# Patient Record
Sex: Male | Born: 1935 | Hispanic: No | Marital: Married | Smoking: Never smoker
Health system: Southern US, Community
[De-identification: ages and names within clinical notes are randomized; demographics above are authoritative.]

## PROBLEM LIST (undated history)

## (undated) DIAGNOSIS — I4510 Unspecified right bundle-branch block: Secondary | ICD-10-CM

## (undated) DIAGNOSIS — E785 Hyperlipidemia, unspecified: Secondary | ICD-10-CM

## (undated) DIAGNOSIS — H3530 Unspecified macular degeneration: Secondary | ICD-10-CM

## (undated) DIAGNOSIS — I10 Essential (primary) hypertension: Secondary | ICD-10-CM

## (undated) DIAGNOSIS — H409 Unspecified glaucoma: Secondary | ICD-10-CM

## (undated) DIAGNOSIS — N183 Chronic kidney disease, stage 3 unspecified: Secondary | ICD-10-CM

## (undated) DIAGNOSIS — R32 Unspecified urinary incontinence: Secondary | ICD-10-CM

## (undated) DIAGNOSIS — E7800 Pure hypercholesterolemia, unspecified: Secondary | ICD-10-CM

## (undated) DIAGNOSIS — K635 Polyp of colon: Secondary | ICD-10-CM

## (undated) DIAGNOSIS — G309 Alzheimer's disease, unspecified: Secondary | ICD-10-CM

## (undated) DIAGNOSIS — M545 Low back pain, unspecified: Secondary | ICD-10-CM

## (undated) DIAGNOSIS — K4090 Unilateral inguinal hernia, without obstruction or gangrene, not specified as recurrent: Secondary | ICD-10-CM

## (undated) DIAGNOSIS — R413 Other amnesia: Secondary | ICD-10-CM

## (undated) DIAGNOSIS — I5032 Chronic diastolic (congestive) heart failure: Secondary | ICD-10-CM

## (undated) DIAGNOSIS — F0280 Dementia in other diseases classified elsewhere without behavioral disturbance: Secondary | ICD-10-CM

## (undated) DIAGNOSIS — R972 Elevated prostate specific antigen [PSA]: Secondary | ICD-10-CM

## (undated) DIAGNOSIS — N429 Disorder of prostate, unspecified: Secondary | ICD-10-CM

## (undated) DIAGNOSIS — R9439 Abnormal result of other cardiovascular function study: Secondary | ICD-10-CM

## (undated) HISTORY — DX: Abnormal result of other cardiovascular function study: R94.39

## (undated) HISTORY — DX: Chronic kidney disease, stage 3 unspecified: N18.30

## (undated) HISTORY — DX: Other amnesia: R41.3

## (undated) HISTORY — DX: Polyp of colon: K63.5

## (undated) HISTORY — DX: Unilateral inguinal hernia, without obstruction or gangrene, not specified as recurrent: K40.90

## (undated) HISTORY — DX: Chronic diastolic (congestive) heart failure: I50.32

## (undated) HISTORY — DX: Elevated prostate specific antigen (PSA): R97.20

## (undated) HISTORY — DX: Low back pain: M54.5

## (undated) HISTORY — DX: Unspecified right bundle-branch block: I45.10

## (undated) HISTORY — DX: Pure hypercholesterolemia, unspecified: E78.00

## (undated) HISTORY — DX: Hyperlipidemia, unspecified: E78.5

## (undated) HISTORY — DX: Chronic kidney disease, stage 3 (moderate): N18.3

## (undated) HISTORY — DX: Essential (primary) hypertension: I10

## (undated) HISTORY — DX: Unspecified urinary incontinence: R32

## (undated) HISTORY — DX: Low back pain, unspecified: M54.50

---

## 1994-02-18 HISTORY — PX: GALLBLADDER SURGERY: SHX652

## 2005-02-18 DIAGNOSIS — K635 Polyp of colon: Secondary | ICD-10-CM

## 2005-02-18 HISTORY — DX: Polyp of colon: K63.5

## 2012-05-14 ENCOUNTER — Encounter: Payer: Self-pay | Admitting: Podiatry

## 2012-05-20 ENCOUNTER — Ambulatory Visit: Payer: Self-pay | Admitting: Podiatry

## 2012-06-12 ENCOUNTER — Ambulatory Visit: Payer: Self-pay | Admitting: Podiatry

## 2012-07-20 ENCOUNTER — Telehealth: Payer: Self-pay | Admitting: Neurology

## 2012-07-20 NOTE — Telephone Encounter (Signed)
Called patient daughter to let her know,her father results where read to her brother and a sister in December of 2013. And nothing was alarming she states no one told her so that may be where the confusion coming from. She also wanted to see if her father can be worked in for next week so her sister who is a M.D can  Go over her father's care. I let her know i can place him on a cancellation list for next week, and have Dr. Pearlean Brownie call Rashmi M.D. # [phone] and go over her father care. Patient daughter showed understanding and relay to his other children. I will send a copy of test results to patient, Patient chart in [facility]

## 2012-07-28 ENCOUNTER — Telehealth: Payer: Self-pay | Admitting: Neurology

## 2012-07-28 NOTE — Telephone Encounter (Signed)
Called and spoke to daughter Everardo Beals). She just wanted test results mailed to patients address. Everardo Beals) is a doctor and would like a call at some know rush whenever Dr.Sethi has time because I know results are ok. [phone].  Will send to medical  Records to send results. ([facility]).

## 2012-07-28 NOTE — Telephone Encounter (Signed)
Patient's daughter Everardo Beals) is calling to let us know they are waiting for the results of MRI, Labs and EEG.  They are asking to please give them a call at [phone]

## 2012-10-02 ENCOUNTER — Other Ambulatory Visit: Payer: Self-pay | Admitting: *Deleted

## 2012-10-02 ENCOUNTER — Telehealth: Payer: Self-pay | Admitting: Neurology

## 2012-10-02 DIAGNOSIS — R269 Unspecified abnormalities of gait and mobility: Secondary | ICD-10-CM

## 2012-10-02 NOTE — Telephone Encounter (Signed)
Called patient to let them know Dr.Sethi has put in a referral for pt.

## 2012-10-21 ENCOUNTER — Ambulatory Visit: Payer: Medicare Other | Admitting: Physical Therapy

## 2012-10-23 ENCOUNTER — Ambulatory Visit: Payer: Medicare Other | Admitting: Physical Therapy

## 2012-10-23 ENCOUNTER — Other Ambulatory Visit: Payer: Self-pay

## 2012-10-23 ENCOUNTER — Ambulatory Visit: Payer: Medicare Other | Attending: Internal Medicine | Admitting: Physical Therapy

## 2012-10-23 DIAGNOSIS — R262 Difficulty in walking, not elsewhere classified: Secondary | ICD-10-CM | POA: Insufficient documentation

## 2012-10-23 DIAGNOSIS — IMO0001 Reserved for inherently not codable concepts without codable children: Secondary | ICD-10-CM | POA: Insufficient documentation

## 2012-10-23 DIAGNOSIS — M6281 Muscle weakness (generalized): Secondary | ICD-10-CM | POA: Insufficient documentation

## 2012-10-23 MED ORDER — MEMANTINE HCL 10 MG PO TABS: 10.0000 mg | ORAL_TABLET | Freq: Two times a day (BID) | ORAL | Status: DC

## 2012-10-27 ENCOUNTER — Ambulatory Visit: Payer: Medicare Other | Admitting: Physical Therapy

## 2012-10-30 ENCOUNTER — Ambulatory Visit: Payer: Medicare Other | Admitting: Physical Therapy

## 2012-11-03 ENCOUNTER — Ambulatory Visit: Payer: Medicare Other | Admitting: Physical Therapy

## 2012-11-04 ENCOUNTER — Ambulatory Visit: Payer: Medicare Other | Admitting: Physical Therapy

## 2012-11-09 ENCOUNTER — Encounter: Payer: Medicare Other | Admitting: Physical Therapy

## 2012-11-10 ENCOUNTER — Ambulatory Visit: Payer: Medicare Other | Admitting: Physical Therapy

## 2012-11-13 ENCOUNTER — Ambulatory Visit: Payer: Medicare Other | Admitting: Physical Therapy

## 2012-11-13 ENCOUNTER — Other Ambulatory Visit: Payer: Self-pay

## 2012-11-13 MED ORDER — DONEPEZIL HCL 10 MG PO TABS: 10.0000 mg | ORAL_TABLET | ORAL | Status: DC

## 2012-11-17 ENCOUNTER — Ambulatory Visit: Payer: Medicare Other | Admitting: Physical Therapy

## 2012-11-24 ENCOUNTER — Ambulatory Visit: Payer: Medicare Other | Attending: Internal Medicine | Admitting: Physical Therapy

## 2012-11-24 DIAGNOSIS — R262 Difficulty in walking, not elsewhere classified: Secondary | ICD-10-CM | POA: Insufficient documentation

## 2012-11-24 DIAGNOSIS — M6281 Muscle weakness (generalized): Secondary | ICD-10-CM | POA: Insufficient documentation

## 2012-11-24 DIAGNOSIS — IMO0001 Reserved for inherently not codable concepts without codable children: Secondary | ICD-10-CM | POA: Insufficient documentation

## 2012-12-01 ENCOUNTER — Ambulatory Visit: Payer: Medicare Other | Admitting: Physical Therapy

## 2012-12-08 ENCOUNTER — Ambulatory Visit: Payer: Medicare Other | Admitting: Physical Therapy

## 2012-12-11 ENCOUNTER — Ambulatory Visit: Payer: Medicare Other | Admitting: Physical Therapy

## 2013-01-03 ENCOUNTER — Emergency Department (HOSPITAL_BASED_OUTPATIENT_CLINIC_OR_DEPARTMENT_OTHER): Payer: Medicare Other

## 2013-01-03 ENCOUNTER — Emergency Department (HOSPITAL_BASED_OUTPATIENT_CLINIC_OR_DEPARTMENT_OTHER)
Admission: EM | Admit: 2013-01-03 | Discharge: 2013-01-03 | Disposition: A | Discharge status: Home / Self Care | Payer: Medicare Other | Attending: Emergency Medicine | Admitting: Emergency Medicine

## 2013-01-03 ENCOUNTER — Encounter (HOSPITAL_BASED_OUTPATIENT_CLINIC_OR_DEPARTMENT_OTHER): Payer: Self-pay | Admitting: Emergency Medicine

## 2013-01-03 DIAGNOSIS — G309 Alzheimer's disease, unspecified: Secondary | ICD-10-CM | POA: Insufficient documentation

## 2013-01-03 DIAGNOSIS — F0280 Dementia in other diseases classified elsewhere without behavioral disturbance: Secondary | ICD-10-CM | POA: Insufficient documentation

## 2013-01-03 DIAGNOSIS — R0602 Shortness of breath: Secondary | ICD-10-CM | POA: Insufficient documentation

## 2013-01-03 DIAGNOSIS — Z79899 Other long term (current) drug therapy: Secondary | ICD-10-CM | POA: Insufficient documentation

## 2013-01-03 DIAGNOSIS — E7800 Pure hypercholesterolemia, unspecified: Secondary | ICD-10-CM | POA: Insufficient documentation

## 2013-01-03 DIAGNOSIS — H409 Unspecified glaucoma: Secondary | ICD-10-CM | POA: Insufficient documentation

## 2013-01-03 DIAGNOSIS — E785 Hyperlipidemia, unspecified: Secondary | ICD-10-CM | POA: Insufficient documentation

## 2013-01-03 DIAGNOSIS — R1110 Vomiting, unspecified: Secondary | ICD-10-CM | POA: Insufficient documentation

## 2013-01-03 DIAGNOSIS — R7989 Other specified abnormal findings of blood chemistry: Secondary | ICD-10-CM

## 2013-01-03 DIAGNOSIS — Z87448 Personal history of other diseases of urinary system: Secondary | ICD-10-CM | POA: Insufficient documentation

## 2013-01-03 DIAGNOSIS — R799 Abnormal finding of blood chemistry, unspecified: Secondary | ICD-10-CM | POA: Insufficient documentation

## 2013-01-03 HISTORY — DX: Unspecified macular degeneration: H35.30

## 2013-01-03 HISTORY — DX: Dementia in other diseases classified elsewhere, unspecified severity, without behavioral disturbance, psychotic disturbance, mood disturbance, and anxiety: F02.80

## 2013-01-03 HISTORY — DX: Pure hypercholesterolemia, unspecified: E78.00

## 2013-01-03 HISTORY — DX: Alzheimer's disease, unspecified: G30.9

## 2013-01-03 HISTORY — DX: Unspecified glaucoma: H40.9

## 2013-01-03 HISTORY — DX: Disorder of prostate, unspecified: N42.9

## 2013-01-03 LAB — PRO B NATRIURETIC PEPTIDE: Pro B Natriuretic peptide (BNP): 2648 pg/mL — ABNORMAL HIGH (ref 0–450)

## 2013-01-03 LAB — CBC
MCHC: 32 g/dL (ref 30.0–36.0)
MCV: 91.1 fL (ref 78.0–100.0)
Platelets: 188 10*3/uL (ref 150–400)
RBC: 3.71 MIL/uL — ABNORMAL LOW (ref 4.22–5.81)
WBC: 5.6 10*3/uL (ref 4.0–10.5)

## 2013-01-03 LAB — COMPREHENSIVE METABOLIC PANEL
ALT: 10 U/L (ref 0–53)
AST: 14 U/L (ref 0–37)
Albumin: 3.1 g/dL — ABNORMAL LOW (ref 3.5–5.2)
CO2: 28 mEq/L (ref 19–32)
Calcium: 8.7 mg/dL (ref 8.4–10.5)
Chloride: 103 mEq/L (ref 96–112)
Creatinine, Ser: 1.7 mg/dL — ABNORMAL HIGH (ref 0.50–1.35)
GFR calc non Af Amer: 35 mL/min — ABNORMAL LOW (ref >90)
Sodium: 138 mEq/L (ref 135–145)
Total Bilirubin: 0.2 mg/dL — ABNORMAL LOW (ref 0.3–1.2)
Total Protein: 6.1 g/dL (ref 6.0–8.3)

## 2013-01-03 LAB — LIPASE, BLOOD: Lipase: 39 U/L (ref 11–59)

## 2013-01-03 MED ORDER — FUROSEMIDE 20 MG PO TABS: 20.0000 mg | ORAL_TABLET | Freq: Every day | ORAL | Status: DC

## 2013-01-03 MED ORDER — FUROSEMIDE 10 MG/ML IJ SOLN: 20.0000 mg | Freq: Once | INTRAMUSCULAR | Status: DC

## 2013-01-03 MED ORDER — FUROSEMIDE 20 MG PO TABS
20.0000 mg | ORAL_TABLET | Freq: Once | ORAL | Status: AC
  Administered 2013-01-03: 20 mg via ORAL
  Filled 2013-01-03: qty 1

## 2013-01-03 NOTE — ED Provider Notes (Signed)
CSN: [id number]     Arrival date & time 01/03/13  1803 History  This chart was scribed for David Munch, MD by Leone Payor, ED Scribe. This patient was seen in room MH11/MH11 and the patient's care was started 6:17 PM.    Chief Complaint  Patient presents with  . Chest Pain    The history is provided by the patient (and son). No language interpreter was used.     HPI Comments: David [name] is a 79 y.o. male with past medical history of HLD and Alzheimer disease who presents to the Emergency Department complaining of intermittent episodes of chest pain and SOB that began 3 days ago. Pt's son states that he has noticed pt has been walking but has had to stop and catch his breath with every few steps. Pt has a stress test scheduled later this week. Son states pt was walking outside today when he noticed him stopping every few steps to spit up, hold his chest and catch his breath. Pt denies any pain at this time. Son reports pt had 1 episode of vomiting last week. He states pt was on Prilosec but stopped taking it at some point in time. He was started on Prevacid 3 days ago. Pt denies abdominal pain. No history cardiac disease or CVA.    Past Medical History  Diagnosis Date  . Hyperlipemia   . Alzheimer disease   . High cholesterol   . Prostate disease   . Macular degeneration   . Glaucoma    Past Surgical History  Procedure Laterality Date  . Gallbladder surgery  1996   No family history on file. History  Substance Use Topics  . Smoking status: Never Smoker   . Smokeless tobacco: Not on file  . Alcohol Use: No    Review of Systems  Constitutional:       Per HPI, otherwise negative  HENT:       Per HPI, otherwise negative  Respiratory: Positive for shortness of breath.        Per HPI, otherwise negative  Cardiovascular: Positive for chest pain.       Per HPI, otherwise negative  Gastrointestinal: Positive for vomiting (1 episode last week). Negative for abdominal pain.   Endocrine:       Negative aside from HPI  Genitourinary:       Neg aside from HPI   Musculoskeletal:       Per HPI, otherwise negative  Skin: Negative.   Neurological: Negative for syncope.    Allergies  Review of patient's allergies indicates no known allergies.  Home Medications   Current Outpatient Rx  Name  Route  Sig  Dispense  Refill  . atorvastatin (LIPITOR) 10 MG tablet   Oral   Take 10 mg by mouth daily.         Marland Kitchen donepezil (ARICEPT) 10 MG tablet   Oral   Take 1 tablet (10 mg total) by mouth as directed.   30 tablet   1     Please Schedule Appt   . memantine (NAMENDA) 10 MG tablet   Oral   Take 1 tablet (10 mg total) by mouth 2 (two) times daily.   60 tablet   2     Please Schedule Appt    There were no vitals taken for this visit. Physical Exam  Nursing note and vitals reviewed. Constitutional: He is oriented to person, place, and time. He appears well-developed. No distress.  HENT:  Head: Normocephalic and  atraumatic.  Eyes: Conjunctivae and EOM are normal.  Cardiovascular: Normal rate, regular rhythm and normal heart sounds.   Pulmonary/Chest: Effort normal and breath sounds normal. No stridor. No respiratory distress.  Abdominal: Soft. Bowel sounds are normal. He exhibits no distension. There is no tenderness.  Musculoskeletal: He exhibits no edema.  Neurological: He is alert and oriented to person, place, and time.  Skin: Skin is warm and dry.  Psychiatric: He has a normal mood and affect.    ED Course  Procedures   DIAGNOSTIC STUDIES: Oxygen Saturation is 100% on RA, normal by my interpretation.    COORDINATION OF CARE: 6:20 PM Discussed treatment plan with pt at bedside and pt agreed to plan.   Labs Review Labs Reviewed - No data to display Imaging Review No results found.  EKG Interpretation     Ventricular Rate:  67 PR Interval:  184 QRS Duration: 136 QT Interval:  432 QTC Calculation: 456 R Axis:   69 Text  Interpretation:  Sinus rhythm with Premature atrial complexes with Abberant conduction Right bundle branch block Abnormal ECG           cardiac 70 sinus rhythm normal Pulse oximetry 96% room air normal   8:16 PM On repeat exam the patient appears comfortable.  He has no new complaints. I discussed all findings with the patient's son, and the patient's daughter via telephone.  She is a physician.  We all agreed that the patient will be more comfortable, given his dementia, and home, though he will need followup tomorrow with his primary care physician to arrange echocardiogram.  MDM   1. Elevated brain natriuretic peptide (BNP) level     I personally performed the services described in this documentation, which was scribed in my presence. The recorded information has been reviewed and is accurate.  This pleasant elderly male presents after episodes of dyspnea on exertion.  On exam he is awake and alert, with no complaints.  With the patient's description of ongoing dyspnea on exertion, and his sons endorsement of lower extremities edema, heart failure seems causative.  No evidence for ongoing coronary ischemia, though this will be evaluated as well with semiurgent echocardiogram, which will be performed as an outpatient given the patient's dementia, his need to return home for comfort.   David Munch, MD 01/03/13 2017

## 2013-01-03 NOTE — ED Notes (Signed)
Family states that patient has been having intermittent spells of taking a few steps and then having to stop and catch his breath. The family states that he was having chest pain PTA, but the patient denies pain now or prior to arrival. Received aspirin 81mg  prior to arrival as well.

## 2013-01-07 ENCOUNTER — Ambulatory Visit (INDEPENDENT_AMBULATORY_CARE_PROVIDER_SITE_OTHER): Payer: Medicare Other | Admitting: Interventional Cardiology

## 2013-01-07 ENCOUNTER — Encounter: Payer: Self-pay | Admitting: Interventional Cardiology

## 2013-01-07 VITALS — BP 132/64 | HR 70 | Ht 68.0 in | Wt 173.0 lb

## 2013-01-07 DIAGNOSIS — I10 Essential (primary) hypertension: Secondary | ICD-10-CM

## 2013-01-07 DIAGNOSIS — R079 Chest pain, unspecified: Secondary | ICD-10-CM

## 2013-01-07 DIAGNOSIS — R0602 Shortness of breath: Secondary | ICD-10-CM

## 2013-01-07 LAB — BRAIN NATRIURETIC PEPTIDE: Pro B Natriuretic peptide (BNP): 282 pg/mL — ABNORMAL HIGH (ref 0.0–100.0)

## 2013-01-07 NOTE — Progress Notes (Signed)
Patient ID: David [name], male   DOB: 1926-03-20, 79 y.o.   MRN: [id number]     Patient ID: David [name] MRN: [id number] DOB/AGE: 1926/06/01 79 y.o.   Referring Physician Dr. Georgann Housekeeper   Reason for Consultation: Shortness of breath with exertion, chest discomfort, lower extremity edema  HPI: 79 year old man with Alzheimer's dementia.  His family is noted that over the last few months, he has gotten more short of breath with walking. Before this, he walked regularly on a daily basis. He would walk 20-30 minutes without stopping. Now, he will walk for about 10 minutes and has to stop to catch his breath. They have also noticed increased lower chimney swelling. Despite his Alzheimer's, he remains active. He cares for himself for the most part.  Over the last 4 days, he has had 2 episodes of vomiting. The first episode was while walking last Sunday. He had to throw up and his son took him to the emergency room. He had a negative troponin at that time and was sent home. Due to the swelling, he was given a diuretic. They have not started the diuretic. He had been on a diuretic in the past for edema but had problems with incontinence.   The second episode was early in the morning. He woke up and told his wife that he felt some discomfort in his lower chest. He then took a baby aspirin. He vomited after that but then felt better.  He denies any orthopnea. He sleeps on 2 pillows. He has never had to sit up to breathe easier. His other daughter is a rheumatologist in [place]. She would like for him to have a stress test since he has never had that done. Of note, he had an echocardiogram in February of 2014. He had normal left jugular function but evidence of diastolic dysfunction. There is aortic sclerosis, mild mitral regurgitation and mild aortic regurgitation.   Current Outpatient Prescriptions  Medication Sig Dispense Refill  . atorvastatin (LIPITOR) 10 MG tablet Take 10 mg by mouth  daily.      . B Complex-C (SUPER B COMPLEX PO) Take by mouth. daily      . brimonidine (ALPHAGAN P) 0.1 % SOLN 3 drops.      . Cholecalciferol (VITAMIN D3) 3000 UNITS TABS Take by mouth. Daily      . donepezil (ARICEPT) 10 MG tablet Take 1 tablet (10 mg total) by mouth as directed.  30 tablet  1  . fish oil-omega-3 fatty acids 1000 MG capsule Take 2 g by mouth daily.      . folic acid (FOLVITE) 1 MG tablet Take 1 mg by mouth daily.      Marland Kitchen LORazepam (ATIVAN) 0.5 MG tablet As directed      . losartan (COZAAR) 100 MG tablet Take 100 mg by mouth daily.      . memantine (NAMENDA) 10 MG tablet Take 1 tablet (10 mg total) by mouth 2 (two) times daily.  60 tablet  2  . prednisoLONE acetate (PRED FORTE) 1 % ophthalmic suspension As directed      . tamsulosin (FLOMAX) 0.4 MG CAPS capsule Take 0.4 mg by mouth.       No current facility-administered medications for this visit.   Past Medical History  Diagnosis Date  . Hyperlipemia   . Alzheimer disease   . High cholesterol   . Prostate disease   . Macular degeneration   . Glaucoma     No family history on file.  History   Social History  . Marital Status: Married    Spouse Name: N/A    Number of Children: N/A  . Years of Education: N/A   Occupational History  . Not on file.   Social History Main Topics  . Smoking status: Never Smoker   . Smokeless tobacco: Not on file  . Alcohol Use: No  . Drug Use: No  . Sexual Activity: Not on file   Other Topics Concern  . Not on file   Social History Narrative  . No narrative on file    Past Surgical History  Procedure Laterality Date  . Gallbladder surgery  1996      (Not in a hospital admission)  Review of systems complete and found to be negative unless listed above .  No nausea, vomiting.  No fever chills, No focal weakness,  No palpitations.  Physical Exam: Filed Vitals:   01/07/13 1106  BP: 132/64  Pulse: 70    Weight: 173 lb (78.472 kg)  Physical exam:  [place]/AT EOMI No  JVD, No carotid bruit RRR S1S2  No wheezing Soft. NT, nondistended No edema. No focal motor or sensory deficits Normal affect  Labs:   Lab Results  Component Value Date   WBC 5.6 01/03/2013   HGB 10.8* 01/03/2013   HCT 33.8* 01/03/2013   MCV 91.1 01/03/2013   PLT 188 01/03/2013    Recent Labs Lab 01/03/13 1850  NA 138  K 4.5  CL 103  CO2 28  BUN 31*  CREATININE 1.70*  CALCIUM 8.7  PROT 6.1  BILITOT 0.2*  ALKPHOS 73  ALT 10  AST 14  GLUCOSE 99   Lab Results  Component Value Date   TROPONINI <0.30 01/03/2013    No results found for this basename: CHOL   No results found for this basename: HDL   No results found for this basename: LDLCALC   No results found for this basename: TRIG   No results found for this basename: CHOLHDL   No results found for this basename: LDLDIRECT      Radiology: No pneumonia, no pulmonary edema EKG: Normal sinus rhythm, right bundle branch block  ASSESSMENT AND PLAN:  Dyspnea on exertion: I spoke to the patient's daughter who lives in [place]. She is also a physician. She walked with him when she was visiting last week and noticed that he has to stop and catch his breath which is new for him. We'll check BNP. I think it is likely that he has some degree of diastolic dysfunction as shown by his echocardiogram. Will likely need a diuretic. He has had issues with diuretic in the past due to incontinence. She thinks this is just because of his Alzheimer's disease, he forgets to go the bathroom until very last minute. She will work on talking to her family about getting up to go to the bathroom on a regular basis. I also mentioned that he could take a diuretic only on days when he is staying at home.  Chest discomfort: He woke up at night with some epigastric/lower chest discomfort. Will check pharmacologic nuclear study to evaluate for ischemia.  Hypertension: Well controlled on current medical therapy. The daughter was concerned that  this was a new onset hypertension in an older patient. Given his overall status, and the fact his blood pressure is controlled, I would not work him up for secondary causes of hypertension at this point. If he develops refractory hypertension , we could look for other  sources. She is in agreement with this.  Signed:   Fredric Mare, MD, [facility] 01/07/2013, 11:33 AM

## 2013-01-07 NOTE — Patient Instructions (Signed)
Your physician has requested that you have a lexiscan myoview. For further information please visit [email or web address] Please follow instruction sheet, as given.  Your physician recommends that you return for lab work today for BNP.

## 2013-01-08 ENCOUNTER — Other Ambulatory Visit: Payer: Self-pay | Admitting: Neurology

## 2013-01-08 DIAGNOSIS — I10 Essential (primary) hypertension: Secondary | ICD-10-CM | POA: Insufficient documentation

## 2013-01-11 ENCOUNTER — Telehealth: Payer: Self-pay | Admitting: Cardiology

## 2013-01-11 DIAGNOSIS — Z79899 Other long term (current) drug therapy: Secondary | ICD-10-CM

## 2013-01-11 MED ORDER — FUROSEMIDE 20 MG PO TABS: 20.0000 mg | ORAL_TABLET | Freq: Every day | ORAL | Status: DC

## 2013-01-11 NOTE — Telephone Encounter (Signed)
Message copied by Theda Sers on Mon Jan 11, 2013  2:40 PM ------      Message from: Corky Crafts      Created: Fri Jan 08, 2013  4:03 PM       Increased fluid level.  Start Lasix as prescribed by the .>  BMet in one week. ------

## 2013-01-19 ENCOUNTER — Ambulatory Visit (INDEPENDENT_AMBULATORY_CARE_PROVIDER_SITE_OTHER): Payer: Medicare Other | Admitting: Interventional Cardiology

## 2013-01-19 DIAGNOSIS — I10 Essential (primary) hypertension: Secondary | ICD-10-CM

## 2013-01-19 LAB — BASIC METABOLIC PANEL
CO2: 28 mEq/L (ref 19–32)
Chloride: 103 mEq/L (ref 96–112)
Creatinine, Ser: 1.7 mg/dL — ABNORMAL HIGH (ref 0.4–1.5)
Potassium: 4.5 mEq/L (ref 3.5–5.1)
Sodium: 138 mEq/L (ref 135–145)

## 2013-01-25 ENCOUNTER — Telehealth: Payer: Self-pay | Admitting: Cardiology

## 2013-01-25 DIAGNOSIS — Z79899 Other long term (current) drug therapy: Secondary | ICD-10-CM

## 2013-01-25 MED ORDER — FUROSEMIDE 20 MG PO TABS: 20.0000 mg | ORAL_TABLET | Freq: Two times a day (BID) | ORAL | Status: DC

## 2013-01-25 NOTE — Progress Notes (Signed)
Pts daughter notified. Meds updated and labs ordered for 02/02/13.

## 2013-01-25 NOTE — Telephone Encounter (Signed)
Message copied by Theda Sers on Mon Jan 25, 2013  2:41 PM ------      Message from: Corky Crafts      Created: Fri Jan 22, 2013  7:43 PM       OK to double Lasix dose and recheck bmet in one week ------

## 2013-01-25 NOTE — Telephone Encounter (Signed)
Pts daughter notified. Meds updated and rx sent in. Lab ordered for 02/02/13.

## 2013-01-26 ENCOUNTER — Encounter: Payer: Self-pay | Admitting: Cardiology

## 2013-01-26 ENCOUNTER — Ambulatory Visit (HOSPITAL_COMMUNITY): Payer: Medicare Other | Attending: Cardiology | Admitting: Radiology

## 2013-01-26 VITALS — BP 133/46 | Ht 67.0 in | Wt 169.0 lb

## 2013-01-26 DIAGNOSIS — R0609 Other forms of dyspnea: Secondary | ICD-10-CM | POA: Insufficient documentation

## 2013-01-26 DIAGNOSIS — R0989 Other specified symptoms and signs involving the circulatory and respiratory systems: Secondary | ICD-10-CM | POA: Insufficient documentation

## 2013-01-26 DIAGNOSIS — E785 Hyperlipidemia, unspecified: Secondary | ICD-10-CM | POA: Insufficient documentation

## 2013-01-26 DIAGNOSIS — R079 Chest pain, unspecified: Secondary | ICD-10-CM

## 2013-01-26 DIAGNOSIS — I10 Essential (primary) hypertension: Secondary | ICD-10-CM | POA: Insufficient documentation

## 2013-01-26 DIAGNOSIS — I4949 Other premature depolarization: Secondary | ICD-10-CM

## 2013-01-26 DIAGNOSIS — R0602 Shortness of breath: Secondary | ICD-10-CM

## 2013-01-26 DIAGNOSIS — R002 Palpitations: Secondary | ICD-10-CM | POA: Insufficient documentation

## 2013-01-26 MED ORDER — REGADENOSON 0.4 MG/5ML IV SOLN
0.4000 mg | Freq: Once | INTRAVENOUS | Status: AC
  Administered 2013-01-26: 0.4 mg via INTRAVENOUS

## 2013-01-26 MED ORDER — TECHNETIUM TC 99M SESTAMIBI GENERIC - CARDIOLITE
11.0000 | Freq: Once | INTRAVENOUS | Status: AC | PRN
  Administered 2013-01-26: 11 via INTRAVENOUS

## 2013-01-26 MED ORDER — TECHNETIUM TC 99M SESTAMIBI GENERIC - CARDIOLITE
33.0000 | Freq: Once | INTRAVENOUS | Status: AC | PRN
  Administered 2013-01-26: 33 via INTRAVENOUS

## 2013-01-26 NOTE — Progress Notes (Signed)
[facility] SITE 3 NUCLEAR MED [address] [place], [place] [address] [phone]    Cardiology Nuclear Med Study  David [name] is a 79 y.o. male     MRN : [id number]     DOB: [birth year]  Procedure Date: 01/26/2013  Nuclear Med Background Indication for Stress Test:  Evaluation for Ischemia History:  03/2012 ECHO EF: NL Cardiac Risk Factors: Hypertension and Lipids  Symptoms:  Chest Pain, DOE and Palpitations   Nuclear Pre-Procedure Caffeine/Decaff Intake:  None > 12 hrs NPO After: 7:30pm   Lungs:  clear O2 Sat: 95% on room air. IV 0.9% NS with Angio Cath:  22g  IV Site: R Forearm x 1, tolerated well IV Started by:  Irean Hong, RN  Chest Size (in):  42 Cup Size: n/a  Height: 5\' 7"  (1.702 m)  Weight:  169 lb (76.658 kg)  BMI:  Body mass index is 26.46 kg/(m^2). Tech Comments:  Took Cozaar this am    Nuclear Med Study 1 or 2 day study: 1 day  Stress Test Type:  Lexiscan  Reading MD: Lance Muss, MD  Order Authorizing Provider:  Lance Muss, MD  Resting Radionuclide: Technetium 46m Sestamibi  Resting Radionuclide Dose: 10.6 mCi   Stress Radionuclide:  Technetium 12m Sestamibi  Stress Radionuclide Dose: 33.0 mCi           Stress Protocol Rest HR: 60 Stress HR: 94  Rest BP: 133/46 Stress BP: 136/55  Exercise Time (min): n/a METS: n/a   Predicted Max HR: 136 bpm % Max HR: 69.12 bpm Rate Pressure Product: 21308   Dose of Adenosine (mg):  n/a Dose of Lexiscan: 0.4 mg  Dose of Atropine (mg): n/a Dose of Dobutamine: n/a mcg/kg/min (at max HR)  Stress Test Technologist: Milana Na, EMT-P  Nuclear Technologist:  Domenic Polite, CNMT     Rest Procedure:  Myocardial perfusion imaging was performed at rest 45 minutes following the intravenous administration of Technetium 90m Sestamibi. Rest ECG: NSR, RBBB, PVCs  Stress Procedure:  The patient received IV Lexiscan 0.4 mg over 15-seconds.  Technetium 27m Sestamibi injected at 30-seconds.  This patient was sob with the Lexiscan injection. Quantitative spect images were obtained after a 45 minute delay. Stress ECG: Anterior T wave inversion more pronounced  QPS Raw Data Images:  Mild diaphragmatic attenuation.  Normal left ventricular size. Stress Images:  There is decreased uptake in the inferior wall. This is more pronounced aat stress than rest.  There is an apical defect at well at stress compared to rest. Rest Images:  There is decreased uptake in the inferior wall. Subtraction (SDS):  These findings are consistent with ischemia. Transient Ischemic Dilatation (Normal <1.22): 1.03 Lung/Heart Ratio (Normal <0.45):  0.34  Quantitative Gated Spect Images QGS EDV:  115 ml QGS ESV:  54 ml  Impression Exercise Capacity:  Lexiscan with no exercise. BP Response:  Normal blood pressure response. Clinical Symptoms:  There is dyspnea. ECG Impression:  No significant ST segment change suggestive of ischemia. Comparison with Prior Nuclear Study: No images to compare  Overall Impression:  Intermediate risk stress nuclear study with moderate-large area of ischemia in the inferior wall and apex..  LV Ejection Fraction: 53%.  LV Wall Motion:  NL LV Function; NL Wall Motion  Corky Crafts., MD, [facility]

## 2013-01-29 ENCOUNTER — Telehealth: Payer: Self-pay | Admitting: Interventional Cardiology

## 2013-01-29 NOTE — Telephone Encounter (Signed)
Start Imdur 30 mg daily. Spoke to the patient's daughter, who is a physician, at length.  We discussed the abnormal stress test. We discussed cath versus medical therapy. Given his renal insufficiency, age and mild dementia, we opted for medical therapy. She will be coming here during the first week of January. She is planning on being here between January 7 of January 12. Hopefully we can schedule an appointment for him to see me during that time.

## 2013-02-01 MED ORDER — ISOSORBIDE MONONITRATE ER 30 MG PO TB24: 30.0000 mg | ORAL_TABLET | Freq: Every day | ORAL | Status: DC

## 2013-02-01 NOTE — Telephone Encounter (Signed)
[facility], APPT made for 02/25/13

## 2013-02-01 NOTE — Telephone Encounter (Signed)
Pts daughter notified. Imdur sent in and appt made.

## 2013-02-01 NOTE — Addendum Note (Signed)
Addended byOrlene Plum H on: 02/01/2013 11:00 AM   Modules accepted: Orders

## 2013-02-02 ENCOUNTER — Other Ambulatory Visit (INDEPENDENT_AMBULATORY_CARE_PROVIDER_SITE_OTHER): Payer: Medicare Other

## 2013-02-02 DIAGNOSIS — Z79899 Other long term (current) drug therapy: Secondary | ICD-10-CM

## 2013-02-02 LAB — BASIC METABOLIC PANEL
BUN: 35 mg/dL — ABNORMAL HIGH (ref 6–23)
Chloride: 103 mEq/L (ref 96–112)
GFR: 39.39 mL/min — ABNORMAL LOW (ref >60.00)
Potassium: 4.4 mEq/L (ref 3.5–5.1)
Sodium: 137 mEq/L (ref 135–145)

## 2013-02-04 ENCOUNTER — Encounter: Payer: Self-pay | Admitting: Cardiology

## 2013-02-06 ENCOUNTER — Other Ambulatory Visit: Payer: Self-pay | Admitting: Neurology

## 2013-02-24 ENCOUNTER — Other Ambulatory Visit: Payer: Self-pay | Admitting: Neurology

## 2013-02-25 ENCOUNTER — Ambulatory Visit (INDEPENDENT_AMBULATORY_CARE_PROVIDER_SITE_OTHER): Payer: Medicare Other | Admitting: Interventional Cardiology

## 2013-02-25 ENCOUNTER — Encounter (INDEPENDENT_AMBULATORY_CARE_PROVIDER_SITE_OTHER): Payer: Self-pay

## 2013-02-25 ENCOUNTER — Encounter: Payer: Self-pay | Admitting: Interventional Cardiology

## 2013-02-25 ENCOUNTER — Other Ambulatory Visit: Payer: Self-pay | Admitting: Neurology

## 2013-02-25 VITALS — BP 117/53 | HR 75 | Ht 64.0 in | Wt 172.0 lb

## 2013-02-25 DIAGNOSIS — R9430 Abnormal result of cardiovascular function study, unspecified: Secondary | ICD-10-CM

## 2013-02-25 DIAGNOSIS — Z79899 Other long term (current) drug therapy: Secondary | ICD-10-CM

## 2013-02-25 DIAGNOSIS — I10 Essential (primary) hypertension: Secondary | ICD-10-CM

## 2013-02-25 DIAGNOSIS — R0602 Shortness of breath: Secondary | ICD-10-CM

## 2013-02-25 MED ORDER — LOSARTAN POTASSIUM 100 MG PO TABS: 50.0000 mg | ORAL_TABLET | Freq: Every day | ORAL | Status: DC

## 2013-02-25 NOTE — Patient Instructions (Signed)
Your physician wants you to follow-up in: 3 MONTHS with Dr. Eldridge DaceVaranasi. You will receive a reminder letter in the mail two months in advance. If you don't receive a letter, please call our office to schedule the follow-up appointment.  Your physician recommends that you return for lab work on 03/04/13 for bmet.  Your physician has requested that you regularly monitor and record your blood pressure readings at home. Please use the same machine at the same time of day to check your readings and record them. Call Dinia Joynt with BP readings in 1-2 weeks   Your physician has recommended you make the following change in your medication:   1. Decrease Losartan to 50mg  daily.

## 2013-02-25 NOTE — Progress Notes (Signed)
Patient ID: David [name], male   DOB: 1927-01-07, 80 y.o.   MRN: [id number] Patient ID: David [name], male   DOB: 1927-01-07, 80 y.o.   MRN: [id number]     Patient ID: David DamesSatya [name] MRN: [id number] DOB/AGE: 1927-01-07 80 y.o.   Referring Physician Dr. Georgann HousekeeperKarrar Husain   Reason for Consultation: Shortness of breath with exertion, chest discomfort, lower extremity edema  HPI: 80 year old man with Alzheimer's dementia.  His family is noted that over the last few months, he has gotten more short of breath with walking. Before this, he walked regularly on a daily basis. He would walk 20-30 minutes without stopping. Now, he will walk for about 10 minutes and has to stop to catch his breath. They have also noticed increased lower extremity swelling. Despite his Alzheimer's, he remains active. He cares for himself for the most part.  He had a negative workup in the emergency room several months ago. He had a negative troponin at that time and was sent home. Due to the swelling, he was given a diuretic. They have not started the diuretic. He had been on a diuretic in the past for edema but had problems with incontinence.    He denies any orthopnea. He sleeps on 2 pillows. He has never had to sit up to breathe easier. His other daughter is a rheumatologist in [place]. She would like for him to have a stress test since he has never had that done. Of note, he had an echocardiogram in February of 2014. He had normal left ventricular function but evidence of diastolic dysfunction. There is aortic sclerosis, mild mitral regurgitation and mild aortic regurgitation.  In December 2014, he had a stress test. This did show evidence of ischemia.  Given his comorbidities, we elected to manage him medically. Since that time, he denies any chest discomfort. He has been taking his Lasix. He still has some dyspnea on exertion. It is improved but not to where he wants it to be.    Current Outpatient Prescriptions   Medication Sig Dispense Refill  . ARICEPT 10 MG tablet TAKE 1 TABLET BY MOUTH AS DIRECTED.**NEEDS APPOINTMENT**  15 tablet  0  . atorvastatin (LIPITOR) 10 MG tablet Take 10 mg by mouth daily.      . B Complex-C (SUPER B COMPLEX PO) Take by mouth. daily      . brimonidine (ALPHAGAN P) 0.1 % SOLN 3 drops.      . Cholecalciferol (VITAMIN D3) 3000 UNITS TABS Take 1,000 Units by mouth. Daily      . fish oil-omega-3 fatty acids 1000 MG capsule Take 2 g by mouth daily.      . folic acid (FOLVITE) 1 MG tablet Take 1 mg by mouth daily.      . furosemide (LASIX) 20 MG tablet Take 1 tablet (20 mg total) by mouth 2 (two) times daily.  180 tablet  1  . isosorbide mononitrate (IMDUR) 30 MG 24 hr tablet Take 1 tablet (30 mg total) by mouth daily.  30 tablet  6  . losartan (COZAAR) 100 MG tablet Take 100 mg by mouth daily.      . memantine (NAMENDA) 10 MG tablet Take 1 tablet (10 mg total) by mouth 2 (two) times daily.  60 tablet  2  . omeprazole (PRILOSEC) 20 MG capsule Take 20 mg by mouth daily.      . prednisoLONE acetate (PRED FORTE) 1 % ophthalmic suspension As directed      . tamsulosin (FLOMAX) 0.4  MG CAPS capsule Take 0.4 mg by mouth.       No current facility-administered medications for this visit.   Past Medical History  Diagnosis Date  . Hyperlipemia   . Alzheimer disease   . High cholesterol   . Prostate disease   . Macular degeneration   . Glaucoma     History reviewed. No pertinent family history.  History   Social History  . Marital Status: Married    Spouse Name: N/A    Number of Children: N/A  . Years of Education: N/A   Occupational History  . Not on file.   Social History Main Topics  . Smoking status: Never Smoker   . Smokeless tobacco: Not on file  . Alcohol Use: No  . Drug Use: No  . Sexual Activity: Not on file   Other Topics Concern  . Not on file   Social History Narrative  . No narrative on file    Past Surgical History  Procedure Laterality Date  .  Gallbladder surgery  1996      (Not in a hospital admission)  Review of systems complete and found to be negative unless listed above .  No nausea, vomiting.  No fever chills, No focal weakness,  No palpitations.  Physical Exam: Filed Vitals:   02/25/13 1114  BP: 117/53  Pulse: 75    Weight: 172 lb (78.019 kg)  Physical exam:  [place]/AT EOMI No JVD, No carotid bruit RRR S1S2  No wheezing Soft. NT, nondistended No edema. No focal motor or sensory deficits Normal affect  Labs:   Lab Results  Component Value Date   WBC 5.6 01/03/2013   HGB 10.8* 01/03/2013   HCT 33.8* 01/03/2013   MCV 91.1 01/03/2013   PLT 188 01/03/2013   No results found for this basename: NA, K, CL, CO2, BUN, CREATININE, CALCIUM, LABALBU, PROT, BILITOT, ALKPHOS, ALT, AST, GLUCOSE,  in the last 168 hours Lab Results  Component Value Date   TROPONINI <0.30 01/03/2013    No results found for this basename: CHOL   No results found for this basename: HDL   No results found for this basename: LDLCALC   No results found for this basename: TRIG   No results found for this basename: CHOLHDL   No results found for this basename: LDLDIRECT      Radiology: No pneumonia, no pulmonary edema EKG: Normal sinus rhythm, right bundle branch block  ASSESSMENT AND PLAN:  Dyspnea on exertion: I spoke to the patient's daughter who lives in [place]. She is also a physician and accompanied him to the visit today.  I think it is likely that he has some degree of diastolic dysfunction as shown by his echocardiogram. Will need to continue  diuretic. He has had issues with diuretic in the past due to incontinence. She thinks this is just because of his Alzheimer's disease, he forgets to go the bathroom until very last minute. She will work on talking to her family about getting up to go to the bathroom on a regular basis. I also mentioned that he could take a diuretic only on days when he is staying at home.  we discussed  increasing the diuretic to try and improve the shortness of breath, but the daughter was somewhat hesitant.   Chest discomfort:  no further episodes like he had that woke him from sleep. Continue medical therapy. He does have a degree of coronary artery disease based on his abnormal stress test.  There  is room to go up on his isosorbide if symptoms return.  We are also in agreement that a conservative strategy would be better given his renal insufficiency.  Hypertension: Well controlled on current medical therapy. The daughter was concerned  that the patient's renal insufficiency started after initiating losartan. Will decrease losartan 50 mg daily and see if creatinine improves after about a week.  She is in agreement with this.  could look for other antihypertensives. He had leg swelling on amlodipine. We'll try to find a regimen that affects his kidneys the least.  Could try carvedilol if his blood pressure goes up on a lower dose of losartan.   Signed:   Fredric Mare, MD, [facility] 02/25/2013, 11:57 AM

## 2013-03-01 ENCOUNTER — Telehealth: Payer: Self-pay | Admitting: Neurology

## 2013-03-01 MED ORDER — DONEPEZIL HCL 10 MG PO TABS: 10.0000 mg | ORAL_TABLET | Freq: Every day | ORAL | Status: DC

## 2013-03-01 NOTE — Telephone Encounter (Signed)
One refill sent while patient is pending return call regarding appt.  Can send more refills if needed to last until seen once appt is made.

## 2013-03-01 NOTE — Telephone Encounter (Signed)
Patient's daughter calling to state that she would like to schedule a follow up with Dr. Pearlean BrownieSethi since patient has not been seen for a year and half, per patient's daughter. Patient's daughter also requesting refill of Aricept for patient. Please call to schedule.

## 2013-03-01 NOTE — Telephone Encounter (Signed)
Spoke with wife made the patient a appointment for 03-24-2013 with lynn lam. The patient wife is aware that it is not with Dr. Pearlean Browniesethi, so he may need meds until then .

## 2013-03-02 DIAGNOSIS — R0602 Shortness of breath: Secondary | ICD-10-CM | POA: Insufficient documentation

## 2013-03-02 DIAGNOSIS — R9430 Abnormal result of cardiovascular function study, unspecified: Secondary | ICD-10-CM | POA: Insufficient documentation

## 2013-03-03 ENCOUNTER — Other Ambulatory Visit (INDEPENDENT_AMBULATORY_CARE_PROVIDER_SITE_OTHER): Payer: Medicare Other

## 2013-03-03 DIAGNOSIS — Z79899 Other long term (current) drug therapy: Secondary | ICD-10-CM

## 2013-03-03 DIAGNOSIS — E7800 Pure hypercholesterolemia, unspecified: Secondary | ICD-10-CM

## 2013-03-03 LAB — BASIC METABOLIC PANEL
BUN: 40 mg/dL — AB (ref 6–23)
CO2: 28 mEq/L (ref 19–32)
CREATININE: 2.1 mg/dL — AB (ref 0.4–1.5)
Calcium: 8.6 mg/dL (ref 8.4–10.5)
Chloride: 103 mEq/L (ref 96–112)
GFR: 32.84 mL/min — AB (ref >60.00)
Glucose, Bld: 102 mg/dL — ABNORMAL HIGH (ref 70–99)
Potassium: 4.4 mEq/L (ref 3.5–5.1)
Sodium: 137 mEq/L (ref 135–145)

## 2013-03-04 ENCOUNTER — Other Ambulatory Visit: Payer: Medicare Other

## 2013-03-05 ENCOUNTER — Telehealth: Payer: Self-pay | Admitting: Interventional Cardiology

## 2013-03-05 NOTE — Telephone Encounter (Signed)
See Bmet, sent below readings to Dr. Eldridge DaceVaranasi via Bmet result note.

## 2013-03-05 NOTE — Telephone Encounter (Signed)
New message  BP readings:   02/27/13    128/60 02/28/13    106/48 03/01/13     99/54 03/02/13     114/65 03/03/13     116/63 Pulse has been 62-68  Please call and advise

## 2013-03-09 ENCOUNTER — Telehealth: Payer: Self-pay | Admitting: Cardiology

## 2013-03-09 ENCOUNTER — Encounter: Payer: Self-pay | Admitting: Interventional Cardiology

## 2013-03-09 DIAGNOSIS — Z79899 Other long term (current) drug therapy: Secondary | ICD-10-CM

## 2013-03-09 NOTE — Telephone Encounter (Signed)
-----   Message ----- From: Bryson DamesSatya Asche Sent: 03/09/2013 12:09 AM To: [facility] Triage Subject: Visit Follow-Up Question Dr Eldridge DaceVaranasi, My Dad [facility]([place]) has tolerated decreasing losartan BP (SBP < 120/ DBP 60-70s). Has been mildly orthostatic and I had him hold losartan until confirming with you. Any change on lasix dose? He has restarted ASA 81mg . Thank you. Best, Rashmi

## 2013-03-10 NOTE — Telephone Encounter (Signed)
Would hold losartan at this time. Continue Lasix for now.

## 2013-03-10 NOTE — Telephone Encounter (Signed)
lmtrc

## 2013-03-11 ENCOUNTER — Telehealth: Payer: Self-pay | Admitting: Interventional Cardiology

## 2013-03-11 NOTE — Addendum Note (Signed)
Addended byOrlene Plum: Medard Decuir H on: 03/11/2013 12:01 PM   Modules accepted: Orders, Medications

## 2013-03-11 NOTE — Telephone Encounter (Signed)
Pt's daughter notified.

## 2013-03-11 NOTE — Telephone Encounter (Signed)
Follow Up ° ° ° °Returning call from earlier. Please call. °

## 2013-03-11 NOTE — Telephone Encounter (Signed)
Returned call see other notes.

## 2013-03-11 NOTE — Telephone Encounter (Signed)
Per Dr. Eldridge DaceVaranasi recheck Bmet next week. Bmet sch for 03/17/13.

## 2013-03-12 ENCOUNTER — Telehealth: Payer: Self-pay | Admitting: Neurology

## 2013-03-12 NOTE — Telephone Encounter (Signed)
I called the pharmacy.  Spoke with Irving BurtonEmily.  She said they did get the Rx on 01/12, but it is saved on the profile because the patient just picked it up on 01/08 and ins would not pay for the same med again.  I called the patient back.  Mr Dalphine HandingBhardwaj anwered the phone.  Asked for Ms Edythe Lynnyay, they said she was at work and he said he was giving the phone to his wife.  I explained the pharmacy had the Rx on file, but said the ins would not pay because it was too soon.  She said they did not provide a full Rx on the 8th, and he needed to get a refill.  I called the pharmacy back.  Spoke with Irving Burtonmily again and asked if she would process the Rx.  She did, and said it went through without any problems. I called the patient back again.  Mr Dalphine HandingBhardwaj answered the phone.  I told him his Rx would be ready at the pharmacy.  He asked which pharmacy and I told him [facility].  He was very confused and asked that I call back.  I called back again and spoke with Ms Dalphine HandingBhardwaj.  Advised they will fill Rx today.  She will folow up with them and call us back if needed.

## 2013-03-12 NOTE — Telephone Encounter (Signed)
Called because she states that no one has called her back for two weeks concerning her fathers appt and medication. I advised her that her mother was called back the same exact day that she called in and that an appt was scheduled and a refill was sent to the pharmacy. She claims that her mother went to the pharmacy yesterday and nothing was there and that there was no call back. I explained to her that her mother spoke with our office and scheduled an appt and that the pharmacy has acknowledged the RX and received. 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 I apologized and let her know I'm sorry she feels this way but we have done everything that was asked of us. She states she will be checking with her mother to see if anyone spoke with her. She also demanded that we call the pharmacy.

## 2013-03-17 ENCOUNTER — Telehealth: Payer: Self-pay | Admitting: Interventional Cardiology

## 2013-03-17 ENCOUNTER — Other Ambulatory Visit (INDEPENDENT_AMBULATORY_CARE_PROVIDER_SITE_OTHER): Payer: Medicare Other

## 2013-03-17 DIAGNOSIS — Z79899 Other long term (current) drug therapy: Secondary | ICD-10-CM

## 2013-03-17 LAB — BASIC METABOLIC PANEL
BUN: 27 mg/dL — ABNORMAL HIGH (ref 6–23)
CHLORIDE: 102 meq/L (ref 96–112)
CO2: 32 meq/L (ref 19–32)
Calcium: 8.9 mg/dL (ref 8.4–10.5)
Creatinine, Ser: 1.8 mg/dL — ABNORMAL HIGH (ref 0.4–1.5)
GFR: 39.38 mL/min — ABNORMAL LOW (ref >60.00)
GLUCOSE: 86 mg/dL (ref 70–99)
Potassium: 4.3 mEq/L (ref 3.5–5.1)
SODIUM: 139 meq/L (ref 135–145)

## 2013-03-17 NOTE — Telephone Encounter (Signed)
Walk In pt Form " BP reading" Dropped Off  Will hold For Amy

## 2013-03-18 ENCOUNTER — Telehealth: Payer: Self-pay | Admitting: Interventional Cardiology

## 2013-03-18 NOTE — Telephone Encounter (Signed)
New Message  Pt's son called states that the pt's BP has been monitored as instructed.  BP has exceeded 140. BP has been taken 3 times today and each time it has registered between 151-154. Please call back to discuss.

## 2013-03-18 NOTE — Telephone Encounter (Signed)
Per son/ IJ, This morning pt's bp elevated since holding losartan per Dr Hoyle BarrVaranasi's request.  bp 150-164 // 70's I called Asha/ pt's daughter, she states her father is feeling well. I told her to continue to monitor, I reassured her that the readings were ok but to call with further updates. She verbalized understanding.

## 2013-03-18 NOTE — Telephone Encounter (Signed)
Follow up    Pt's daughter called back please give her a call Asha.

## 2013-03-18 NOTE — Telephone Encounter (Signed)
I spoke with David [name] and she wanted to tell me about BP readings. I told her to call me if BP consistently stays above 140/90. She verbalized understanding. FYI to Dr. Eldridge DaceVaranasi.

## 2013-03-20 NOTE — Telephone Encounter (Signed)
If BP still in the same range, systolic > 140, would use amlodipine 5 mg daily. Disp 30, RF 11

## 2013-03-22 ENCOUNTER — Encounter: Payer: Self-pay | Admitting: Nurse Practitioner

## 2013-03-22 NOTE — Telephone Encounter (Signed)
Per Dr. Eldridge DaceVaranasi BP readings reviewed and pt should hold off on adding amlodipine and continue to montior BP.

## 2013-03-22 NOTE — Telephone Encounter (Signed)
Pt's daughter notified.

## 2013-03-24 ENCOUNTER — Encounter: Payer: Self-pay | Admitting: Nurse Practitioner

## 2013-03-24 ENCOUNTER — Ambulatory Visit (INDEPENDENT_AMBULATORY_CARE_PROVIDER_SITE_OTHER): Payer: Medicare Other | Admitting: Nurse Practitioner

## 2013-03-24 VITALS — BP 124/60 | HR 66 | Temp 97.9°F | Ht 64.5 in | Wt 176.0 lb

## 2013-03-24 DIAGNOSIS — F0280 Dementia in other diseases classified elsewhere without behavioral disturbance: Secondary | ICD-10-CM | POA: Insufficient documentation

## 2013-03-24 DIAGNOSIS — F0390 Unspecified dementia without behavioral disturbance: Secondary | ICD-10-CM

## 2013-03-24 DIAGNOSIS — R4587 Impulsiveness: Secondary | ICD-10-CM

## 2013-03-24 DIAGNOSIS — G309 Alzheimer's disease, unspecified: Principal | ICD-10-CM

## 2013-03-24 MED ORDER — MEMANTINE HCL 10 MG PO TABS: ORAL_TABLET | ORAL | Status: DC

## 2013-03-24 MED ORDER — DONEPEZIL HCL 10 MG PO TABS: 10.0000 mg | ORAL_TABLET | Freq: Every day | ORAL | Status: DC

## 2013-03-24 NOTE — Progress Notes (Signed)
PATIENT: David [name] DOB: 1926-05-07   REASON FOR VISIT: follow up for dementia HISTORY FROM: patient  HISTORY OF PRESENT ILLNESS: 80 year old patient with mild dementia x2 years etiology unclear-senile dementia versus early Alzheimer's. He appears clinically stable on objective testing the family feels is slightly worse.  01/15/12 (PS):  He returns for followup after last visit on 10/17/2010. Patient's son note they have noted progressive cognitive decline over the last one year. At last visit I had suggested increasing Aricept to 23 mg which he could not tolerate and reduced the dose back to 10 mg within a week. He is now having difficulty with completing sentences and word finding and at times forgets names of family members. He also has good and bad days. He at times is confused and disoriented. He has not had any significant difficulties with walking or falls. He remains on Aricept 10 mg daily as well as Namenda twice a day and fish oil. He keeps himself busy and mentally challenging tasks across her puzzles, [employer] and even [email or web address] but he has been needing more supervision. He is complaining of mild headache today and states he is having a bad day it did not do well on the Mini-Mental testing and scored only 13/30 with my medical assistant. After I administered the test prostrating the questions to him he is slightly better and scored 15/30 but clearly stated that he knew the answers but could not come up with him today.  UPDATE 03/24/13 (LL): Patient returns for a yearly followup visit his last visit was 01/15/2012.  He is accompanied by his daughter-in-law today.  His memory appears stable, MMSE score today is 18/30.  He has significant difficulty completing sentences and finding words that he wants to say.  He is in good mood and daughter-in-law states that he normally is. Patient has no complaints today.  He is tolerating Aricept 10 mg daily and Namenda 10 mg twice a day well with  no known side effects.  REVIEW OF SYSTEMS: Full 14 system review of systems performed and notable only for: No complaints.  ALLERGIES: No Known Allergies  HOME MEDICATIONS: Outpatient Prescriptions Prior to Visit  Medication Sig Dispense Refill  . atorvastatin (LIPITOR) 10 MG tablet Take 10 mg by mouth daily.      . B Complex-C (SUPER B COMPLEX PO) Take by mouth. daily      . brimonidine (ALPHAGAN P) 0.1 % SOLN 3 drops.      . Cholecalciferol (VITAMIN D3) 3000 UNITS TABS Take 1,000 Units by mouth. Daily      . fish oil-omega-3 fatty acids 1000 MG capsule Take 2 g by mouth daily.      . folic acid (FOLVITE) 1 MG tablet Take 1 mg by mouth daily.      . furosemide (LASIX) 20 MG tablet Take 1 tablet (20 mg total) by mouth 2 (two) times daily.  180 tablet  1  . isosorbide mononitrate (IMDUR) 30 MG 24 hr tablet Take 1 tablet (30 mg total) by mouth daily.  30 tablet  6  . omeprazole (PRILOSEC) 20 MG capsule Take 20 mg by mouth daily.      . prednisoLONE acetate (PRED FORTE) 1 % ophthalmic suspension As directed      . tamsulosin (FLOMAX) 0.4 MG CAPS capsule Take 0.4 mg by mouth.      . donepezil (ARICEPT) 10 MG tablet Take 1 tablet (10 mg total) by mouth daily.  30 tablet  0  . NAMENDA  10 MG tablet TAKE 1 TABLET BY MOUTH TWICE DAILY  60 tablet  0  . losartan (COZAAR) 100 MG tablet        No facility-administered medications prior to visit.    PAST MEDICAL HISTORY: Past Medical History  Diagnosis Date  . Hyperlipemia   . Alzheimer disease   . High cholesterol   . Prostate disease   . Macular degeneration   . Glaucoma     PAST SURGICAL HISTORY: Past Surgical History  Procedure Laterality Date  . Gallbladder surgery  1996    FAMILY HISTORY: History reviewed. No pertinent family history.  SOCIAL HISTORY: History   Social History  . Marital Status: Married    Spouse Name: Myna Hidalgo    Number of Children: 4  . Years of Education: College   Occupational History  . retired     Social History Main Topics  . Smoking status: Never Smoker   . Smokeless tobacco: Never Used  . Alcohol Use: No  . Drug Use: No  . Sexual Activity: Not on file   Other Topics Concern  . Not on file   Social History Narrative   Patient lives at home with son and spouse.   Caffeine Use: 3 cups daily      PHYSICAL EXAM  Filed Vitals:   03/24/13 1544  BP: 124/60  Pulse: 66  Temp: 97.9 F (36.6 C)  TempSrc: Oral  Height: 5' 4.5" (1.638 m)  Weight: 176 lb (79.833 kg)   Body mass index is 29.75 kg/(m^2).  Generalized: Well developed, in no acute distress, pleasant elderly [place] male. Head: normocephalic and atraumatic. Oropharynx benign  Neck: Supple, no carotid bruits  Cardiac: Regular rate rhythm, no murmur  Musculoskeletal: No deformity   Neurological examination  Mentation: Alert oriented to time, place, history taking. Follows one-step commands, has difficulty with some two-step commands speech is clear but nonfluent. MMSE 18/30 with 3 deficits in orientation to time, for deficits in orientation to place, 0/3 delayed recall, comprehension. AFT 1 only. GDS 6. Serial sevens were 5/5. Palmomental reflexes present. Snout absent. Cranial nerve II-XII:  Pupils were equal round reactive to light extraocular movements were full, visual field were full on confrontational test. Facial sensation and strength were normal. hearing was intact to finger rubbing bilaterally. Uvula tongue midline. head turning and shoulder shrug and were normal and symmetric.Tongue protrusion into cheek strength was normal. Motor: normal bulk and tone, full strength in the BUE, BLE, fine finger movements normal, no pronator drift. No focal weakness Sensory: normal and symmetric to light touch Coordination: finger-nose-finger, heel-to-shin bilaterally, no dysmetria Reflexes:  Deep tendon reflexes in the upper and lower extremities are present and symmetric.  Gait and Station: Steady but  broad-based gait with leaning to left and unable to do tandem walking; no festination; minimal apraxia while turning.   ASSESSMENT AND PLAN 80 y.o. year old male  has a past medical history of Hyperlipemia; High cholesterol; Prostate disease; Macular degeneration; and Glaucoma here with progressive dementia times 3-4 years etiology now likely moderate Alzheimer's.  PLAN: 1.Continue Aricept and Namenda at current doses.  New prescriptions have been sent to [facility] for 90 days supply for each, with 3 refills. 2. Follow up visit in 1 year, sooner as needed.  Meds ordered this encounter  Medications  . donepezil (ARICEPT) 10 MG tablet    Sig: Take 1 tablet (10 mg total) by mouth daily.    Dispense:  90 tablet    Refill:  3    Order Specific Question:  Supervising Provider    Answer:  Micki Riley [[id number]]  . memantine (NAMENDA) 10 MG tablet    Sig: TAKE 1 TABLET BY MOUTH TWICE DAILY    Dispense:  180 tablet    Refill:  3    PLEASE SCHEDULE APPT    Order Specific Question:  Supervising Provider    Answer:  Micki Riley [[id number]]   Return in about 1 year (around 03/24/2014).  Ronal Fear, MSN, NP-C 03/24/2013, 5:46 PM [facility] [address] [place], [place] [address] [phone]  Note: This document was prepared with digital dictation and possible smart phrase technology. Any transcriptional errors that result from this process are unintentional.

## 2013-03-24 NOTE — Patient Instructions (Signed)
Continue Aricept and Namenda at current doses.  [facility] for each, with 3 refills.  Follow up visit in 1 year, sooner as needed.

## 2013-04-10 ENCOUNTER — Emergency Department (HOSPITAL_COMMUNITY): Payer: Medicare Other

## 2013-04-10 ENCOUNTER — Encounter (HOSPITAL_COMMUNITY): Payer: Self-pay | Admitting: Emergency Medicine

## 2013-04-10 ENCOUNTER — Emergency Department (HOSPITAL_COMMUNITY)
Admission: EM | Admit: 2013-04-10 | Discharge: 2013-04-10 | Disposition: A | Discharge status: Home / Self Care | Payer: Medicare Other | Attending: Emergency Medicine | Admitting: Emergency Medicine

## 2013-04-10 DIAGNOSIS — E7800 Pure hypercholesterolemia, unspecified: Secondary | ICD-10-CM | POA: Insufficient documentation

## 2013-04-10 DIAGNOSIS — F0280 Dementia in other diseases classified elsewhere without behavioral disturbance: Secondary | ICD-10-CM | POA: Insufficient documentation

## 2013-04-10 DIAGNOSIS — E785 Hyperlipidemia, unspecified: Secondary | ICD-10-CM | POA: Insufficient documentation

## 2013-04-10 DIAGNOSIS — R112 Nausea with vomiting, unspecified: Secondary | ICD-10-CM | POA: Insufficient documentation

## 2013-04-10 DIAGNOSIS — N429 Disorder of prostate, unspecified: Secondary | ICD-10-CM | POA: Insufficient documentation

## 2013-04-10 DIAGNOSIS — G309 Alzheimer's disease, unspecified: Secondary | ICD-10-CM | POA: Insufficient documentation

## 2013-04-10 DIAGNOSIS — R109 Unspecified abdominal pain: Secondary | ICD-10-CM | POA: Insufficient documentation

## 2013-04-10 DIAGNOSIS — H409 Unspecified glaucoma: Secondary | ICD-10-CM | POA: Insufficient documentation

## 2013-04-10 DIAGNOSIS — Z79899 Other long term (current) drug therapy: Secondary | ICD-10-CM | POA: Insufficient documentation

## 2013-04-10 LAB — URINALYSIS, ROUTINE W REFLEX MICROSCOPIC
Bilirubin Urine: NEGATIVE
GLUCOSE, UA: NEGATIVE mg/dL
KETONES UR: NEGATIVE mg/dL
Nitrite: NEGATIVE
Protein, ur: NEGATIVE mg/dL
Specific Gravity, Urine: 1.024 (ref 1.005–1.030)
Urobilinogen, UA: 0.2 mg/dL (ref 0.0–1.0)
pH: 6 (ref 5.0–8.0)

## 2013-04-10 LAB — CBC WITH DIFFERENTIAL/PLATELET
BASOS PCT: 0 % (ref 0–1)
Basophils Absolute: 0 10*3/uL (ref 0.0–0.1)
Eosinophils Absolute: 0 10*3/uL (ref 0.0–0.7)
Eosinophils Relative: 0 % (ref 0–5)
HCT: 42 % (ref 39.0–52.0)
Hemoglobin: 13.9 g/dL (ref 13.0–17.0)
LYMPHS ABS: 0.5 10*3/uL — AB (ref 0.7–4.0)
Lymphocytes Relative: 4 % — ABNORMAL LOW (ref 12–46)
MCH: 29.5 pg (ref 26.0–34.0)
MCHC: 33.1 g/dL (ref 30.0–36.0)
MCV: 89.2 fL (ref 78.0–100.0)
MONOS PCT: 6 % (ref 3–12)
Monocytes Absolute: 0.7 10*3/uL (ref 0.1–1.0)
Neutro Abs: 10.4 10*3/uL — ABNORMAL HIGH (ref 1.7–7.7)
Neutrophils Relative %: 90 % — ABNORMAL HIGH (ref 43–77)
Platelets: 215 10*3/uL (ref 150–400)
RBC: 4.71 MIL/uL (ref 4.22–5.81)
RDW: 13.6 % (ref 11.5–15.5)
WBC: 11.6 10*3/uL — ABNORMAL HIGH (ref 4.0–10.5)

## 2013-04-10 LAB — COMPREHENSIVE METABOLIC PANEL
ALT: 14 U/L (ref 0–53)
AST: 21 U/L (ref 0–37)
Albumin: 3.9 g/dL (ref 3.5–5.2)
Alkaline Phosphatase: 79 U/L (ref 39–117)
BILIRUBIN TOTAL: 0.5 mg/dL (ref 0.3–1.2)
BUN: 34 mg/dL — AB (ref 6–23)
CHLORIDE: 97 meq/L (ref 96–112)
CO2: 30 mEq/L (ref 19–32)
Calcium: 9.4 mg/dL (ref 8.4–10.5)
Creatinine, Ser: 1.66 mg/dL — ABNORMAL HIGH (ref 0.50–1.35)
GFR calc non Af Amer: 36 mL/min — ABNORMAL LOW (ref >90)
GFR, EST AFRICAN AMERICAN: 42 mL/min — AB (ref >90)
GLUCOSE: 96 mg/dL (ref 70–99)
Potassium: 4.2 mEq/L (ref 3.7–5.3)
Sodium: 140 mEq/L (ref 137–147)
Total Protein: 7.9 g/dL (ref 6.0–8.3)

## 2013-04-10 LAB — URINE MICROSCOPIC-ADD ON

## 2013-04-10 LAB — I-STAT CG4 LACTIC ACID, ED: LACTIC ACID, VENOUS: 0.94 mmol/L (ref 0.5–2.2)

## 2013-04-10 LAB — LIPASE, BLOOD: LIPASE: 21 U/L (ref 11–59)

## 2013-04-10 MED ORDER — IOHEXOL 300 MG/ML  SOLN
100.0000 mL | Freq: Once | INTRAMUSCULAR | Status: AC | PRN
  Administered 2013-04-10: 100 mL via INTRAVENOUS

## 2013-04-10 MED ORDER — SODIUM CHLORIDE 0.9 % IV BOLUS (SEPSIS)
1000.0000 mL | Freq: Once | INTRAVENOUS | Status: AC
  Administered 2013-04-10: 1000 mL via INTRAVENOUS

## 2013-04-10 MED ORDER — ONDANSETRON HCL 4 MG/2ML IJ SOLN
4.0000 mg | Freq: Once | INTRAMUSCULAR | Status: AC
  Administered 2013-04-10: 4 mg via INTRAVENOUS
  Filled 2013-04-10: qty 2

## 2013-04-10 MED ORDER — IOHEXOL 300 MG/ML  SOLN
20.0000 mL | INTRAMUSCULAR | Status: DC
  Administered 2013-04-10: 20 mL via ORAL

## 2013-04-10 NOTE — ED Provider Notes (Signed)
CSN: [id number]     Arrival date & time 04/10/13  1426 History   First MD Initiated Contact with Patient 04/10/13 1907     Chief Complaint  Patient presents with  . Nausea    Patient is a [age] y.o. male presenting with vomiting.  Emesis Severity:  Mild Duration:  6 days Timing:  Intermittent Quality:  Stomach contents Progression:  Resolved Associated symptoms: abdominal pain   Associated symptoms: no diarrhea, no headaches and no sore throat     Past Medical History  Diagnosis Date  . Hyperlipemia   . Alzheimer disease   . High cholesterol   . Prostate disease   . Macular degeneration   . Glaucoma    Past Surgical History  Procedure Laterality Date  . Gallbladder surgery  1996   No family history on file. History  Substance Use Topics  . Smoking status: Never Smoker   . Smokeless tobacco: Never Used  . Alcohol Use: No    Review of Systems  Constitutional: Negative for fever, activity change and appetite change.  HENT: Negative for congestion, ear pain, rhinorrhea, sinus pressure and sore throat.   Eyes: Negative for pain and redness.  Respiratory: Negative for cough, chest tightness and shortness of breath.   Cardiovascular: Negative for chest pain and palpitations.  Gastrointestinal: Positive for nausea, vomiting and abdominal pain. Negative for diarrhea and abdominal distention.  Genitourinary: Negative for dysuria, flank pain and difficulty urinating.  Musculoskeletal: Negative for back pain, neck pain and neck stiffness.  Skin: Negative for rash and wound.  Neurological: Negative for dizziness, light-headedness, numbness and headaches.  Hematological: Negative for adenopathy.  Psychiatric/Behavioral: Negative for behavioral problems, confusion and agitation.      Allergies  Review of patient's allergies indicates no known allergies.  Home Medications   Current Outpatient Rx  Name  Route  Sig  Dispense  Refill  . atorvastatin (LIPITOR) 10 MG tablet    Oral   Take 10 mg by mouth daily.         . B Complex-C (SUPER B COMPLEX PO)   Oral   Take by mouth. daily         . brimonidine (ALPHAGAN P) 0.1 % SOLN   Both Eyes   Place 1 drop into both eyes 3 (three) times daily.          . Cholecalciferol (VITAMIN D-3) 1000 UNITS CAPS   Oral   Take 1,000 Units by mouth daily.         Marland Kitchen. donepezil (ARICEPT) 10 MG tablet   Oral   Take 1 tablet (10 mg total) by mouth daily.   90 tablet   3   . fish oil-omega-3 fatty acids 1000 MG capsule   Oral   Take 1 g by mouth daily.          . folic acid (FOLVITE) 1 MG tablet   Oral   Take 1 mg by mouth daily.         . furosemide (LASIX) 20 MG tablet   Oral   Take 1 tablet (20 mg total) by mouth 2 (two) times daily.   180 tablet   1   . isosorbide mononitrate (IMDUR) 30 MG 24 hr tablet   Oral   Take 1 tablet (30 mg total) by mouth daily.   30 tablet   6   . memantine (NAMENDA) 10 MG tablet   Oral   Take 10 mg by mouth daily.         .Marland Kitchen  omeprazole (PRILOSEC) 20 MG capsule   Oral   Take 20 mg by mouth daily.         . tamsulosin (FLOMAX) 0.4 MG CAPS capsule   Oral   Take 0.8 mg by mouth every evening.           BP 151/51  Pulse 86  Temp(Src) 98.8 F (37.1 C) (Oral)  Resp 18  SpO2 96% Physical Exam  Constitutional: He appears well-developed and well-nourished. No distress.  HENT:  Head: Normocephalic and atraumatic.  Nose: Nose normal.  Mouth/Throat: Oropharynx is clear and moist.  Eyes: Conjunctivae and EOM are normal. Pupils are equal, round, and reactive to light.  Neck: Normal range of motion. Neck supple. No tracheal deviation present.  Cardiovascular: Normal rate, regular rhythm, normal heart sounds and intact distal pulses.   Pulmonary/Chest: Effort normal and breath sounds normal. No respiratory distress. He has no rales.  Abdominal: Soft. Bowel sounds are normal. He exhibits no distension. There is no tenderness. There is no rebound and no guarding.   Musculoskeletal: Normal range of motion. He exhibits no edema and no tenderness.  Neurological: He is alert.  Oriented to person   Skin: Skin is warm and dry.  Psychiatric: He has a normal mood and affect. His behavior is normal.    ED Course  Procedures (including critical care time) Labs Review  Results for orders placed during the hospital encounter of 04/10/13  CBC WITH DIFFERENTIAL      Result Value Ref Range   WBC 11.6 (*) 4.0 - 10.5 K/uL   RBC 4.71  4.22 - 5.81 MIL/uL   Hemoglobin 13.9  13.0 - 17.0 g/dL   HCT 16.1  09.6 - 04.5 %   MCV 89.2  78.0 - 100.0 fL   MCH 29.5  26.0 - 34.0 pg   MCHC 33.1  30.0 - 36.0 g/dL   RDW 40.9  81.1 - 91.4 %   Platelets 215  150 - 400 K/uL   Neutrophils Relative % 90 (*) 43 - 77 %   Neutro Abs 10.4 (*) 1.7 - 7.7 K/uL   Lymphocytes Relative 4 (*) 12 - 46 %   Lymphs Abs 0.5 (*) 0.7 - 4.0 K/uL   Monocytes Relative 6  3 - 12 %   Monocytes Absolute 0.7  0.1 - 1.0 K/uL   Eosinophils Relative 0  0 - 5 %   Eosinophils Absolute 0.0  0.0 - 0.7 K/uL   Basophils Relative 0  0 - 1 %   Basophils Absolute 0.0  0.0 - 0.1 K/uL  COMPREHENSIVE METABOLIC PANEL      Result Value Ref Range   Sodium 140  137 - 147 mEq/L   Potassium 4.2  3.7 - 5.3 mEq/L   Chloride 97  96 - 112 mEq/L   CO2 30  19 - 32 mEq/L   Glucose, Bld 96  70 - 99 mg/dL   BUN 34 (*) 6 - 23 mg/dL   Creatinine, Ser 7.82 (*) 0.50 - 1.35 mg/dL   Calcium 9.4  8.4 - 95.6 mg/dL   Total Protein 7.9  6.0 - 8.3 g/dL   Albumin 3.9  3.5 - 5.2 g/dL   AST 21  0 - 37 U/L   ALT 14  0 - 53 U/L   Alkaline Phosphatase 79  39 - 117 U/L   Total Bilirubin 0.5  0.3 - 1.2 mg/dL   GFR calc non Af Amer 36 (*) >90 mL/min   GFR calc  Af Amer 42 (*) >90 mL/min  URINALYSIS, ROUTINE W REFLEX MICROSCOPIC      Result Value Ref Range   Color, Urine YELLOW  YELLOW   APPearance CLEAR  CLEAR   Specific Gravity, Urine 1.024  1.005 - 1.030   pH 6.0  5.0 - 8.0   Glucose, UA NEGATIVE  NEGATIVE mg/dL   Hgb urine  dipstick SMALL (*) NEGATIVE   Bilirubin Urine NEGATIVE  NEGATIVE   Ketones, ur NEGATIVE  NEGATIVE mg/dL   Protein, ur NEGATIVE  NEGATIVE mg/dL   Urobilinogen, UA 0.2  0.0 - 1.0 mg/dL   Nitrite NEGATIVE  NEGATIVE   Leukocytes, UA SMALL (*) NEGATIVE  URINE MICROSCOPIC-ADD ON      Result Value Ref Range   WBC, UA 0-2  <3 WBC/hpf   RBC / HPF 3-6  <3 RBC/hpf  LIPASE, BLOOD      Result Value Ref Range   Lipase 21  11 - 59 U/L  I-STAT CG4 LACTIC ACID, ED      Result Value Ref Range   Lactic Acid, Venous 0.94  0.5 - 2.2 mmol/L    Imaging Review Ct Abdomen Pelvis W Contrast  04/10/2013   CLINICAL DATA:  Vomiting.  EXAM: CT ABDOMEN AND PELVIS WITH CONTRAST  TECHNIQUE: Multidetector CT imaging of the abdomen and pelvis was performed using the standard protocol following bolus administration of intravenous contrast.  CONTRAST:  OMNIPAQUE IOHEXOL 300 MG/ML  SOLN  COMPARISON:  Abdominal radiograph performed earlier today at 12:25 p.m.  FINDINGS: The visualized lung bases are clear. Diffuse coronary artery calcifications are seen. A small hiatal hernia is suspected.  The liver and spleen are unremarkable in appearance. The patient is status post cholecystectomy, with clips noted at the gallbladder fossa. The pancreas and adrenal glands are unremarkable.  The kidneys are unremarkable in appearance. There is no evidence of hydronephrosis. No renal or ureteral stones are seen. No perinephric stranding is appreciated.  No free fluid is identified. The small bowel is unremarkable in appearance. The stomach is otherwise within normal limits. No acute vascular abnormalities are seen. Scattered calcification is seen along the abdominal aorta and its branches.  The appendix is not definitely seen; there is no evidence for appendicitis. Note is made of herniation of the distal ileum into a small right inguinal hernia, without evidence for obstruction or strangulation. The colon is unremarkable in appearance.  The  bladder is mildly distended and grossly unremarkable. The prostate is enlarged, measuring 5.1 cm in transverse dimension. No inguinal lymphadenopathy is seen.  No acute osseous abnormalities are identified. Multilevel vacuum phenomenon is noted along the lumbar spine, with grade 1 retrolisthesis of L2 on L3, and mild grade 1 anterolisthesis of L5 on S1, reflecting underlying facet disease.  IMPRESSION: 1. No definite focal abnormality seen to explain the patient's symptoms. 2. Herniation of a short segment of distal ileum into a small right inguinal hernia, without evidence for obstruction or strangulation at this time. Contrast progresses into the cecum. 3. Suspect small hiatal hernia. 4. Enlarged prostate noted. 5. Diffuse coronary artery calcifications seen. 6. Mild diffuse degenerative change along the lumbar spine. 7. Scattered calcification along the abdominal aorta and its branches.   Electronically Signed   By: Roanna Raider M.D.   On: 04/10/2013 21:43    MDM   Final diagnoses:  Nausea and vomiting    80 yo M in NAD AFVSS who presents with sudden onset nausea and emesis earlier this morning. Emesis has  been nbnb. Be also endorsed mild diffuse abd pain. He was seen at urgent care center received phenergan and was being discharged but XR was read as possible SBO. Patient then presented to ED for furhter evaluation. On exam he appears mildly dehydrated. He himself denies any abd pain or nausea now. He does not recall passing any flatus or having a BM today. He specidifically denies any dysuria, hematuria, constipation, CP or SOB. Patient has hx of dementia.   10:04 PM Patient received 1 L NS hydration. Lipase wnl no abd pain throughout doubt pancreatitis. CT scan without obstruction.  Doubt SBO. Patient feels well. Denies any abd pain or nausea at this time.   Thorough discussion with his family on plan, findings, and return precautions.   Case dsicussed with my attending Dr. Ethelda Chick.    Strong return precautions given.   Nadara Mustard, MD 04/10/13 2232

## 2013-04-10 NOTE — Discharge Instructions (Signed)

## 2013-04-10 NOTE — ED Provider Notes (Signed)
Level V caveat dementia had multiple episodes of vomiting earlier today. Presently asymptomatic. Denies pain anywhere. Exam patient is alert follows simple commands abdomen soft nontender heart regular in rhythm lungs clear auscultation. CT scan and lab work reviewed with family. No evidence of bowel obstruction. Patient is a we'll hold down fluids without vomiting in the emergency department  Doug SouSam Amaal Dimartino, MD 04/10/13 2217

## 2013-04-10 NOTE — ED Notes (Signed)
Son stated, My dad stated throwing up last night around 0100 til this morning at 0600 according to my mom.  We took him to [facility] and you saw Donette LarryHusain and did a xray which showed a small obstruction.  He was given a shot of Phenergan and told to come to make sure he is not dehydrated and check on the obstruction.  .Marland Kitchen

## 2013-04-10 NOTE — ED Notes (Signed)
Family states understanding of discharge instructions 

## 2013-04-11 NOTE — ED Provider Notes (Signed)
I have personally seen and examined the patient.  I have discussed the plan of care with the resident.  I have reviewed the documentation on PMH/FH/Soc. History.  I have reviewed the documentation of the resident and agree.  Doug SouSam Clemie General, MD 04/11/13 [phone]

## 2013-04-29 ENCOUNTER — Telehealth: Payer: Self-pay | Admitting: Interventional Cardiology

## 2013-04-29 NOTE — Telephone Encounter (Signed)
Spoke with pts wife and they checks pts Bp daily. Over the last 3 days 160-140's/70's. Pt is feeling okay and hasnt had any complaints. Pt is no longer on losartan, but he is taking lasix.

## 2013-04-29 NOTE — Telephone Encounter (Signed)
New Message  Pt wife called she states that her husbands BP is higher than normal 178/? (no further readings) offered PA or NP for a sooner appt pt declined. Only request is to see Dr. Eldridge DaceVaranasi. Request a call back from the nurse to discuss BP// Please call

## 2013-04-30 ENCOUNTER — Telehealth: Payer: Self-pay | Admitting: Interventional Cardiology

## 2013-04-30 MED ORDER — CARVEDILOL 3.125 MG PO TABS: 3.1250 mg | ORAL_TABLET | Freq: Two times a day (BID) | ORAL | Status: DC

## 2013-04-30 NOTE — Telephone Encounter (Signed)
Pts wife notified. Coreg sent in.

## 2013-04-30 NOTE — Telephone Encounter (Signed)
He had swelling with amlodipine.   Try Carvedilol 3.125 mg PO BID, Disp 60, RF 11

## 2013-04-30 NOTE — Addendum Note (Signed)
Addended byOrlene Plum: Joyous Gleghorn H on: 04/30/2013 09:22 AM   Modules accepted: Orders

## 2013-04-30 NOTE — Telephone Encounter (Signed)
New Prob    Calling regarding new prescription sent to pharmacy. Please call.

## 2013-04-30 NOTE — Telephone Encounter (Signed)
Called pts daughter David [name] and spoke with her about the coreg. Pts Bp today was 120/80. I told pts daughter with pts erratic BP he should go ahead and try BP. She would like for pt to be seen in the office before starting medication.

## 2013-04-30 NOTE — Telephone Encounter (Signed)
Appt made with Lawson FiscalLori on 05/05/13 and pts daughter notified. FYI to Dr. Eldridge DaceVaranasi

## 2013-05-05 ENCOUNTER — Encounter: Payer: Self-pay | Admitting: Nurse Practitioner

## 2013-05-05 ENCOUNTER — Ambulatory Visit (INDEPENDENT_AMBULATORY_CARE_PROVIDER_SITE_OTHER): Payer: Medicare Other | Admitting: Nurse Practitioner

## 2013-05-05 VITALS — BP 108/56 | HR 66 | Ht 66.0 in | Wt 174.8 lb

## 2013-05-05 DIAGNOSIS — I10 Essential (primary) hypertension: Secondary | ICD-10-CM

## 2013-05-05 DIAGNOSIS — R0602 Shortness of breath: Secondary | ICD-10-CM

## 2013-05-05 NOTE — Patient Instructions (Addendum)
I would continue with the current medicines - but no more Coreg  Monitor the blood pressure - various times - keep a record - call for readings consistently above 160 or consistently below 110  See Dr. Eldridge DaceVaranasi back planned  Call the [facility] office at [phone] if you have any questions, problems or concerns.

## 2013-05-05 NOTE — Progress Notes (Signed)
David DamesSatya Ginty Date of Birth: 04-Jul-1926 Medical Record #[id number]  History of Present Illness: David [name] is seen back today for a work in visit. Seen for Dr. Eldridge DaceVaranasi. He is an 80 year old male with alzheimer's dementia, diastolic dysfunction by echo from February 2014, CKD, HLD, HTN, macular degeneration, glaucoma and advanced age. Daughter is a physician in [place].   Last seen here in January - had been to the ER several months prior with swelling. Negative troponin. Given diuretic but did not take initially. Has had long standing issues with incontinence. Had a stress test in December of 2014 - showing evidence of ischemia - elected to manage conservatively due to multiple co morbidities and renal function. At his last visit it was noted that he continued to have some dyspnea - felt to be from diastolic dysfunction. Increase in the diuretic was encourage but daughter was quite hesitant. Daughter also concerned about renal insufficiency with ARB - this was cut back. Has had swelling with Norvasc in the past.  Phone call with the family last week - BP erratic - Coreg had previously been recommended but they wished for an OV prior to starting. They had already stopped the Losartan. Was in the ER last month with abrupt onset of nausea/vomiting - seemed to be benign.   Comes in today. Here with family. The sequence of events is quite hard to follow - sounds like they never cut the Losartan back - just stopped it. Has only taken the Coreg for 2 to 3 days - HR got down in the 30's. They stopped that. BP is a little labile - he will have readings down to 100 systolic and up to 170. Looks like he averages in the 140's for the most part. He is feeling ok. Does not seem to have chest pain. Not being dizzy or lightheaded. No passing out. Tries to restrict salt. Some swelling but likes to sit with his legs down. Hard to get support stockings. They are wondering if he should be on more Imdur. Also asking about  travel.    Current Outpatient Prescriptions  Medication Sig Dispense Refill  . atorvastatin (LIPITOR) 10 MG tablet Take 10 mg by mouth daily.      . B Complex-C (SUPER B COMPLEX PO) Take by mouth. daily      . Cholecalciferol (VITAMIN D-3) 1000 UNITS CAPS Take 1,000 Units by mouth daily.      Marland Kitchen. donepezil (ARICEPT) 10 MG tablet Take 1 tablet (10 mg total) by mouth daily.  90 tablet  3  . fish oil-omega-3 fatty acids 1000 MG capsule Take 1 g by mouth daily.       . folic acid (FOLVITE) 1 MG tablet Take 1 mg by mouth daily.      . furosemide (LASIX) 20 MG tablet Take 1 tablet (20 mg total) by mouth 2 (two) times daily.  180 tablet  1  . isosorbide mononitrate (IMDUR) 30 MG 24 hr tablet Take 1 tablet (30 mg total) by mouth daily.  30 tablet  6  . memantine (NAMENDA) 10 MG tablet Take 10 mg by mouth daily.      Marland Kitchen. omeprazole (PRILOSEC) 20 MG capsule Take 20 mg by mouth daily.      . tamsulosin (FLOMAX) 0.4 MG CAPS capsule Take 0.8 mg by mouth every evening.       . carvedilol (COREG) 3.125 MG tablet Take 1 tablet (3.125 mg total) by mouth 2 (two) times daily.  60 tablet  11   No current facility-administered medications for this visit.    No Known Allergies  Past Medical History  Diagnosis Date  . Hyperlipemia   . Alzheimer disease   . High cholesterol   . Prostate disease   . Macular degeneration   . Glaucoma   . Memory deficit   . Low back pain   . Colon polyp 2007  . Elevated PSA     4-7 in 2009  . Hypercholesteremia   . Urinary incontinence   . CKD (chronic kidney disease), stage III   . Hypertension     grade 1 diastolic dysfunction, trace MR and TR, mild LVH Echo 3-/4    Past Surgical History  Procedure Laterality Date  . Gallbladder surgery  1996    History  Smoking status  . Never Smoker   Smokeless tobacco  . Never Used    History  Alcohol Use No    No family history on file.  Review of Systems: The review of systems is per the HPI.  All other systems  were reviewed and are negative.  Physical Exam: BP 108/56  Pulse 66  Ht 5\' 6"  (1.676 m)  Wt 174 lb 12.8 oz (79.289 kg)  BMI 28.23 kg/m2  SpO2 95% Patient is very pleasant and in no acute distress. Skin is warm and dry. Color is normal.  HEENT is unremarkable. Normocephalic/atraumatic. PERRL. Sclera are nonicteric. Neck is supple. No masses. No JVD. Lungs are clear. Cardiac exam shows a regular rate and rhythm. Abdomen is soft. Extremities are with trace edema. Gait and ROM are intact. No gross neurologic deficits noted.  LABORATORY DATA: Lab Results  Component Value Date   WBC 11.6* 04/10/2013   HGB 13.9 04/10/2013   HCT 42.0 04/10/2013   PLT 215 04/10/2013   GLUCOSE 96 04/10/2013   ALT 14 04/10/2013   AST 21 04/10/2013   NA 140 04/10/2013   K 4.2 04/10/2013   CL 97 04/10/2013   CREATININE 1.66* 04/10/2013   BUN 34* 04/10/2013   CO2 30 04/10/2013    Myoview Impression  Exercise Capacity: Lexiscan with no exercise.  BP Response: Normal blood pressure response.  Clinical Symptoms: There is dyspnea.  ECG Impression: No significant ST segment change suggestive of ischemia.  Comparison with Prior Nuclear Study: No images to compare  Overall Impression: Intermediate risk stress nuclear study with moderate-large area of ischemia in the inferior wall and apex..  LV Ejection Fraction: 53%. LV Wall Motion: NL LV Function; NL Wall Motion  David [name]., MD, [facility]     Assessment / Plan: 1. Dyspnea - does not seem to be a real issue/complaint at this time.   2. HTN - BP for the most part is acceptable given his age. I do not think he needs additional medicines. I think more medicines at this time will only make his lower readings lower - could cause him to be dizzy and fall. I have asked them to continue to monitor. Their cuff correlates fairly well (only 8 point difference - theirs is 8 points lower).   3. Progressive alzheimer's dementia   4. Abnormal Myoview - managed conservatively -  no active symptoms noted.   See Dr. Eldridge Dace back in May as planned.   Patient is agreeable to this plan and will call if any problems develop in the interim.   David Macadamia, RN, ANP-C [facility] [address] [place], [place]  [address] [phone]

## 2013-06-09 ENCOUNTER — Telehealth: Payer: Self-pay | Admitting: Interventional Cardiology

## 2013-06-09 NOTE — Telephone Encounter (Signed)
New Message:  Pt's daughter states the pt is leaving for a trip on May 9th and wants to know if he is ok to travel. Requesting a call back from the nurse

## 2013-06-09 NOTE — Telephone Encounter (Signed)
Daughter of patient states patient is planning a trip to [place] on May 9th. He is scheduled to see Dr. Eldridge DaceVaranasi at the end of May.  Daughter is wanting to know if 1) it is okay for patient to travel to [place] (air travel) and 2) would Dr. Eldridge DaceVaranasi prefer to see the patient prior to travel?

## 2013-06-15 ENCOUNTER — Other Ambulatory Visit: Payer: Self-pay | Admitting: Interventional Cardiology

## 2013-06-15 NOTE — Telephone Encounter (Signed)
Lm on daughter's secure vm.

## 2013-06-15 NOTE — Telephone Encounter (Signed)
As long as he is feeling well, he can go to [place]. I will plan on seeing him as scheduled in May.

## 2013-06-15 NOTE — Telephone Encounter (Signed)
Follow Up:  Pt's daughter is calling back in reference to her discussion from last week.

## 2013-06-17 ENCOUNTER — Telehealth: Payer: Self-pay | Admitting: Interventional Cardiology

## 2013-06-17 NOTE — Telephone Encounter (Signed)
Pts daughter notified. Pt denies SOB.

## 2013-06-17 NOTE — Telephone Encounter (Signed)
Per Dr. Eldridge DaceVaranasi pt can still travel, he will need to try and walk every hour on the plane and elevate legs once flight is over.

## 2013-06-17 NOTE — Telephone Encounter (Signed)
New message         Daughter is calling and pt feet are swollen. Is he ok to travel to [place]? She would like a call today please.

## 2013-07-13 ENCOUNTER — Ambulatory Visit: Payer: Medicare Other | Admitting: Interventional Cardiology

## 2013-08-15 ENCOUNTER — Other Ambulatory Visit: Payer: Self-pay | Admitting: Interventional Cardiology

## 2013-09-27 ENCOUNTER — Other Ambulatory Visit: Payer: Self-pay | Admitting: Interventional Cardiology

## 2013-10-26 ENCOUNTER — Other Ambulatory Visit: Payer: Self-pay | Admitting: Interventional Cardiology

## 2013-11-15 ENCOUNTER — Other Ambulatory Visit: Payer: Self-pay | Admitting: Interventional Cardiology

## 2013-11-23 ENCOUNTER — Telehealth: Payer: Self-pay | Admitting: *Deleted

## 2013-11-23 NOTE — Telephone Encounter (Signed)
Patient son calling in to schedule appointment with Dr Pearlean BrownieSethi, appointment scheduled for 12/15/13 at 3:30 pm.

## 2013-11-25 ENCOUNTER — Ambulatory Visit (INDEPENDENT_AMBULATORY_CARE_PROVIDER_SITE_OTHER): Payer: Medicare Other | Admitting: Interventional Cardiology

## 2013-11-25 ENCOUNTER — Encounter: Payer: Self-pay | Admitting: Interventional Cardiology

## 2013-11-25 VITALS — BP 118/52 | HR 60 | Ht 66.0 in | Wt 174.6 lb

## 2013-11-25 DIAGNOSIS — R0602 Shortness of breath: Secondary | ICD-10-CM

## 2013-11-25 DIAGNOSIS — R9430 Abnormal result of cardiovascular function study, unspecified: Secondary | ICD-10-CM

## 2013-11-25 DIAGNOSIS — G309 Alzheimer's disease, unspecified: Secondary | ICD-10-CM

## 2013-11-25 DIAGNOSIS — I10 Essential (primary) hypertension: Secondary | ICD-10-CM

## 2013-11-25 DIAGNOSIS — F0280 Dementia in other diseases classified elsewhere without behavioral disturbance: Secondary | ICD-10-CM

## 2013-11-25 LAB — BASIC METABOLIC PANEL
BUN: 24 mg/dL — ABNORMAL HIGH (ref 6–23)
CHLORIDE: 102 meq/L (ref 96–112)
CO2: 33 mEq/L — ABNORMAL HIGH (ref 19–32)
Calcium: 8.7 mg/dL (ref 8.4–10.5)
Creatinine, Ser: 1.5 mg/dL (ref 0.4–1.5)
GFR: 48.4 mL/min — ABNORMAL LOW (ref >60.00)
Glucose, Bld: 81 mg/dL (ref 70–99)
POTASSIUM: 3.6 meq/L (ref 3.5–5.1)
Sodium: 139 mEq/L (ref 135–145)

## 2013-11-25 NOTE — Patient Instructions (Signed)
Your physician recommends that you return for lab work today for [employer].  Your physician recommends that you continue on your current medications as directed. Please refer to the Current Medication list given to you today.  Your physician wants you to follow-up in: 6 months with Dr. Eldridge DaceVaranasi. You will receive a reminder letter in the mail two months in advance. If you don't receive a letter, please call our office to schedule the follow-up appointment.

## 2013-11-25 NOTE — Progress Notes (Signed)
Patient ID: Byrd Rushlow, male   DOB: [birth year], 80 y.o.   MRN: [id number]   Guillermo Nehring Date of Birth: 1926/06/25 Medical Record #[id number]  History of Present Illness: Mr. Sako is an 80 year old male with alzheimer's dementia, diastolic dysfunction by echo from February 2014, CKD, HLD, HTN, macular degeneration, glaucoma and advanced age. Daughter is a physician in [place].   Seen here in January 2015- had been to the ER several months prior with swelling. Negative troponin. Given diuretic but did not take initially. Has had long standing issues with incontinence. Had a stress test in December of 2014 - showing evidence of ischemia - elected to manage conservatively due to multiple co morbidities and renal dysfunction.  Due to dyspnea - felt to be from diastolic dysfunction, increased dose of diuretic was encouraged but daughter was quite hesitant. Daughter also concerned about renal insufficiency with ARB - this was cut back. Has had swelling with Norvasc in the past.    Comes in today. Here with family. BP okay off of Losartan.  Stopped Coreg after a few days for HR got down in the 30's. They stopped that. BP has been controlled, typically 110-120 systolic. He is feeling ok. Does not seem to have chest pain. Not being dizzy or lightheaded. No passing out. Tries to restrict salt. Some swelling but likes to sit with his legs down. Hard to get support stockings.  He does not elevate legs.    Current Outpatient Prescriptions  Medication Sig Dispense Refill  . aspirin 81 MG tablet Take 81 mg by mouth daily.      Marland Kitchen atorvastatin (LIPITOR) 10 MG tablet Take 10 mg by mouth daily.      . B Complex-C (SUPER B COMPLEX PO) Take by mouth. daily      . brimonidine (ALPHAGAN) 0.15 % ophthalmic solution Place 1 drop into both eyes 3 (three) times daily.       . Cholecalciferol (VITAMIN D-3) 1000 UNITS CAPS Take 1,000 Units by mouth daily.      Marland Kitchen donepezil (ARICEPT) 10 MG tablet Take 1 tablet (10 mg  total) by mouth daily.  90 tablet  3  . fish oil-omega-3 fatty acids 1000 MG capsule Take 1 g by mouth daily.       . folic acid (FOLVITE) 1 MG tablet Take 1 mg by mouth daily.      . furosemide (LASIX) 20 MG tablet TAKE 1 TABLET BY MOUTH TWICE DAILY  180 tablet  0  . isosorbide mononitrate (IMDUR) 30 MG 24 hr tablet TAKE 1 TABLET BY MOUTH EVERY DAY  30 tablet  0  . memantine (NAMENDA) 10 MG tablet Take 10 mg by mouth daily.      Marland Kitchen NITROSTAT 0.6 MG SL tablet Take 0.6 mg by mouth every 5 (five) minutes as needed for chest pain.       Marland Kitchen omeprazole (PRILOSEC) 20 MG capsule Take 20 mg by mouth daily.      . tamsulosin (FLOMAX) 0.4 MG CAPS capsule Take 0.8 mg by mouth every evening.        No current facility-administered medications for this visit.    Allergies  Allergen Reactions  . Coreg [Carvedilol]     bradycardia  . Norvasc [Amlodipine Besylate]     swelling    Past Medical History  Diagnosis Date  . Hyperlipemia   . Alzheimer disease   . High cholesterol   . Prostate disease   . Macular degeneration   .  Glaucoma   . Memory deficit   . Low back pain   . Colon polyp 2007  . Elevated PSA     4-7 in 2009  . Hypercholesteremia   . Urinary incontinence   . CKD (chronic kidney disease), stage III   . Hypertension     grade 1 diastolic dysfunction, trace MR and TR, mild LVH Echo 3-/4    Past Surgical History  Procedure Laterality Date  . Gallbladder surgery  1996    History  Smoking status  . Never Smoker   Smokeless tobacco  . Never Used    History  Alcohol Use No    No family history on file.  Review of Systems: The review of systems is per the HPI.  All other systems were reviewed and are negative.  Physical Exam: BP 118/52  Pulse 60  Ht 5\' 6"  (1.676 m)  Wt 174 lb 9.6 oz (79.198 kg)  BMI 28.19 kg/m2 Patient is very pleasant and in no acute distress.  Skin is warm and dry. Color is normal.   HEENT is unremarkable.  Normocephalic/atraumatic.  PERRL.  Sclera are nonicteric.  Neck is supple. No masses. No JVD.  Lungs are clear.  Cardiac exam shows a regular rate and rhythm.  Abdomen is soft.  Extremities are with 1+ ankle edema bilaterally.   No gross neurologic deficits noted.  LABORATORY DATA: Lab Results  Component Value Date   WBC 11.6* 04/10/2013   HGB 13.9 04/10/2013   HCT 42.0 04/10/2013   PLT 215 04/10/2013   GLUCOSE 96 04/10/2013   ALT 14 04/10/2013   AST 21 04/10/2013   NA 140 04/10/2013   K 4.2 04/10/2013   CL 97 04/10/2013   CREATININE 1.66* 04/10/2013   BUN 34* 04/10/2013   CO2 30 04/10/2013    Myoview Impression  Exercise Capacity: Lexiscan with no exercise.  BP Response: Normal blood pressure response.  Clinical Symptoms: There is dyspnea.  ECG Impression: No significant ST segment change suggestive of ischemia.  Comparison with Prior Nuclear Study: No images to compare  Overall Impression: Intermediate risk stress nuclear study with moderate-large area of ischemia in the inferior wall and apex..  LV Ejection Fraction: 53%. LV Wall Motion: NL LV Function; NL Wall Motion  Corky CraftsVARANASI,Wenceslao Loper S., MD, [facility]     Assessment / Plan: 1. Dyspnea - mild with exertion, but is not limiting him too much. COntrolled on diuretic.  2. HTN - BP for the most part is acceptable given his age. I do not think he needs additional medicines. I think more medicines at this time will only make his lower readings lower - could cause him to be dizzy and fall. I have asked them to continue to monitor. Their cuff correlates fairly well (only 8 point difference - theirs is 8 points lower).   3. Progressive alzheimer's dementia   4. Abnormal Myoview - managed conservatively - no active symptoms noted.     Patient is agreeable to this plan and will call if any problems develop in the interim.   Stashia Sia S.  [facility] [address] [place], [place]  [id number] [phone]

## 2013-11-26 ENCOUNTER — Other Ambulatory Visit: Payer: Self-pay | Admitting: Interventional Cardiology

## 2013-12-15 ENCOUNTER — Ambulatory Visit: Payer: Self-pay | Admitting: Neurology

## 2013-12-16 ENCOUNTER — Encounter: Payer: Self-pay | Admitting: Neurology

## 2013-12-30 ENCOUNTER — Ambulatory Visit (INDEPENDENT_AMBULATORY_CARE_PROVIDER_SITE_OTHER): Payer: Medicare Other | Admitting: Neurology

## 2013-12-30 ENCOUNTER — Encounter: Payer: Self-pay | Admitting: Neurology

## 2013-12-30 ENCOUNTER — Encounter: Payer: Self-pay | Admitting: *Deleted

## 2013-12-30 VITALS — BP 142/68 | HR 66 | Ht 65.0 in | Wt 176.0 lb

## 2013-12-30 DIAGNOSIS — F0280 Dementia in other diseases classified elsewhere without behavioral disturbance: Secondary | ICD-10-CM

## 2013-12-30 DIAGNOSIS — G309 Alzheimer's disease, unspecified: Secondary | ICD-10-CM

## 2013-12-30 MED ORDER — MEMANTINE HCL 10 MG PO TABS: 10.0000 mg | ORAL_TABLET | Freq: Two times a day (BID) | ORAL | Status: DC

## 2013-12-30 NOTE — Patient Instructions (Addendum)
I had a long discussion with the patient and his son with regards to his dementia, discussed the effects of medications like Aricept and Namenda being modest at the most.There are ongoing clinical trials looking at other therapy options which may consider in the future. I recommend increasing Namenda to 10 mg twice daily which is the recommended dose. Continue Aricept 10 mg daily as he has been unable to tolerate a higher dose in the past. Return for follow-up in 3 months or call earlier if necessary

## 2013-12-30 NOTE — Progress Notes (Signed)
PATIENT: David [name] DOB: April 28, 1926   REASON FOR VISIT: follow up for dementia HISTORY FROM: patient  HISTORY OF PRESENT ILLNESS: 80 year old patient with mild dementia x2 years etiology unclear-senile dementia versus early Alzheimer's. He appears clinically stable on objective testing the family feels is slightly worse.  01/15/12 (PS):  He returns for followup after last visit on 10/17/2010. Patient's son note they have noted progressive cognitive decline over the last one year. At last visit I had suggested increasing Aricept to 23 mg which he could not tolerate and reduced the dose back to 10 mg within a week. He is now having difficulty with completing sentences and word finding and at times forgets names of family members. He also has good and bad days. He at times is confused and disoriented. He has not had any significant difficulties with walking or falls. He remains on Aricept 10 mg daily as well as Namenda twice a day and fish oil. He keeps himself busy and mentally challenging tasks across her puzzles, [employer] and even [email or web address] but he has been needing more supervision. He is complaining of mild headache today and states he is having a bad day it did not do well on the Mini-Mental testing and scored only 13/30 with my medical assistant. After I administered the test prostrating the questions to him he is slightly better and scored 15/30 but clearly stated that he knew the answers but could not come up with him today.  UPDATE 03/24/13 (LL): Patient returns for a yearly followup visit his last visit was 01/15/2012.  He is accompanied by his daughter-in-law today.  His memory appears stable, MMSE score today is 18/30.  He has significant difficulty completing sentences and finding words that he wants to say.  He is in good mood and daughter-in-law states that he normally is. Patient has no complaints today.  He is tolerating Aricept 10 mg daily and Namenda 10 mg twice a day well with  no known side effects. UPDATE 12/30/2013 (PS) :he returns for follow-up accompanied by his son. He seems to be doing about the same. He had a recent trip to [place] his [place] for a month to spend time with his daughters. Continues to have memory and cognitive difficulties which are essentially unchanged. He has occasional time finding words and completing sentences. Is usually in a good mood. He is tolerating Aricept 10 mg daily but for some reason is on Namenda 10 mg once a day only. He is having no GI or CNS side effects. He has had trouble tolerating the higher dose of Aricept and Namenda XR in the past.18/30 which is unchanged from last visit REVIEW OF SYSTEMS: Full 14 system review of systems performed and notable only for: leg swelling, choking, incontinence of bladder, memory loss and snoring and all other systems negative ALLERGIES: Allergies  Allergen Reactions  . Coreg [Carvedilol]     bradycardia  . Norvasc [Amlodipine Besylate]     swelling    HOME MEDICATIONS: Outpatient Prescriptions Prior to Visit  Medication Sig Dispense Refill  . aspirin 81 MG tablet Take 81 mg by mouth daily.    Marland Kitchen. atorvastatin (LIPITOR) 10 MG tablet Take 10 mg by mouth daily.    . B Complex-C (SUPER B COMPLEX PO) Take by mouth. daily    . brimonidine (ALPHAGAN) 0.15 % ophthalmic solution Place 1 drop into both eyes 3 (three) times daily.     . Cholecalciferol (VITAMIN D-3) 1000 UNITS CAPS Take 1,000 Units by  mouth daily.    Marland Kitchen. donepezil (ARICEPT) 10 MG tablet Take 1 tablet (10 mg total) by mouth daily. 90 tablet 3  . fish oil-omega-3 fatty acids 1000 MG capsule Take 1 g by mouth daily.     . folic acid (FOLVITE) 1 MG tablet Take 1 mg by mouth daily.    . furosemide (LASIX) 20 MG tablet TAKE 1 TABLET BY MOUTH TWICE DAILY 180 tablet 0  . isosorbide mononitrate (IMDUR) 30 MG 24 hr tablet TAKE 1 TABLET BY MOUTH EVERY DAY 30 tablet 11  . NITROSTAT 0.6 MG SL tablet Take 0.6 mg by mouth every 5 (five)  minutes as needed for chest pain.     Marland Kitchen. omeprazole (PRILOSEC) 20 MG capsule Take 20 mg by mouth daily.    . tamsulosin (FLOMAX) 0.4 MG CAPS capsule Take 0.8 mg by mouth every evening.     . memantine (NAMENDA) 10 MG tablet Take 10 mg by mouth daily.    Marland Kitchen. FLUZONE HIGH-DOSE 0.5 ML SUSY   0   No facility-administered medications prior to visit.    PAST MEDICAL HISTORY: Past Medical History  Diagnosis Date  . Hyperlipemia   . Alzheimer disease   . High cholesterol   . Prostate disease   . Macular degeneration   . Glaucoma   . Memory deficit   . Low back pain   . Colon polyp 2007  . Elevated PSA     4-7 in 2009  . Hypercholesteremia   . Urinary incontinence   . CKD (chronic kidney disease), stage III   . Hypertension     grade 1 diastolic dysfunction, trace MR and TR, mild LVH Echo 3-/4    PAST SURGICAL HISTORY: Past Surgical History  Procedure Laterality Date  . Gallbladder surgery  1996    FAMILY HISTORY: Family History  Problem Relation Age of Onset  . Cancer Daughter     BREAST  . Dementia Mother     SOCIAL HISTORY: History   Social History  . Marital Status: Married    Spouse Name: Myna HidalgoKarma    Number of Children: 4  . Years of Education: College   Occupational History  . retired    Social History Main Topics  . Smoking status: Never Smoker   . Smokeless tobacco: Never Used  . Alcohol Use: No  . Drug Use: No  . Sexual Activity: Not on file   Other Topics Concern  . Not on file   Social History Narrative   Patient is married with 4 children.   Patient is right handed.   Patient has college education.   Caffeine Use: 3 cups daily      PHYSICAL EXAM  Filed Vitals:   12/30/13 1527  BP: 142/68  Pulse: 66  Height: 5\' 5"  (1.651 m)  Weight: 176 lb (79.833 kg)   Body mass index is 29.29 kg/(m^2).  Generalized: pleasant elderly [place] male, in no acute distress, pleasant elderly [place] male. Head: normocephalic and  atraumatic.   Neck: Supple, no carotid bruits  Cardiac: Regular rate rhythm, no murmur  Musculoskeletal: small sebaceous cyst on the forehead in midline  Neurological examination  Mentation: Alert oriented to time, place,  . Follows one-step commands, has difficulty with some two-step commands speech is clear but nonfluent. MMSE 18/30 with 3 deficits in orientation to time, for deficits in orientation to place, 0/3 delayed recall, comprehension. AFT 0 only. GDS 5.  Clock drawing 3/4  Palmomental reflexes present.  Snout absent. Cranial nerve II-XII:  Pupils were equal round reactive to light extraocular movements were full, visual field were full on confrontational test. Facial sensation and strength were normal. hearing was intact to finger rubbing bilaterally. Uvula tongue midline. head turning and shoulder shrug and were normal and symmetric.Tongue protrusion into cheek strength was normal. Motor: normal bulk and tone, full strength in the BUE, BLE, fine finger movements normal, no pronator drift. No focal weakness Sensory: normal and symmetric to light touch Coordination: finger-nose-finger, heel-to-shin bilaterally, no dysmetria Reflexes:  Deep tendon reflexes in the upper and lower extremities are present and symmetric.  Gait and Station: Steady but broad-based gait with leaning to left and unable to do tandem walking; no festination; minimal apraxia while turning.   ASSESSMENT AND PLAN 80 y.o. year old male  has a past medical history of Hyperlipemia; High cholesterol; Prostate disease; Macular degeneration; and Glaucoma here with progressive dementia times 3-4 years etiology now likely moderate Alzheimer's.  PLAN: I had a long discussion with the patient and his son with regards to his dementia, discussed the effects of medications like Aricept and Namenda being modest at the most.There are ongoing clinical trials looking at other therapy options which may consider in the future. I recommend  increasing Namenda to 10 mg twice daily which is the recommended dose. Continue Aricept 10 mg daily as he has been unable to tolerate a higher dose in the past. Return for follow-up in 3 months or call earlier if necessary   Meds ordered this encounter  Medications  . memantine (NAMENDA) 10 MG tablet    Sig: Take 1 tablet (10 mg total) by mouth 2 (two) times daily.    Dispense:  60 tablet    Refill:  3   Return in about 3 months (around 04/01/2014).  Delia Heady, MD  12/30/2013, 4:53 PM [facility] [address] [place], [place] [address] [phone]  Note: This document was prepared with digital dictation and possible smart phrase technology. Any transcriptional errors that result from this process are unintentional.

## 2014-01-28 ENCOUNTER — Encounter: Payer: Self-pay | Admitting: Podiatry

## 2014-01-28 ENCOUNTER — Ambulatory Visit (INDEPENDENT_AMBULATORY_CARE_PROVIDER_SITE_OTHER): Payer: Medicare Other | Admitting: Podiatry

## 2014-01-28 ENCOUNTER — Other Ambulatory Visit: Payer: Self-pay | Admitting: Interventional Cardiology

## 2014-01-28 VITALS — BP 130/73 | HR 65

## 2014-01-28 DIAGNOSIS — B351 Tinea unguium: Secondary | ICD-10-CM | POA: Insufficient documentation

## 2014-01-28 NOTE — Progress Notes (Signed)
Subjective: 80 year old male presents with wife complaining of thick difficult nail on both great toes and 2nd left. They were cutting his nail at home and made the 3rd toe to bleed.  Objective: Pedal pulses are palpable on both feet. Positive of mild pedal edema on left foot. Thick dystrophic nails both great toes and 2nd toe left.  Assessment: Onychomycosis x both great toes, and 2nd left.  Plan: All problematic nails debrided. Return as needed.

## 2014-01-28 NOTE — Patient Instructions (Signed)
Debrided hypertrophic nails x 10. Return in 3 months or as needed.

## 2014-03-15 ENCOUNTER — Telehealth: Payer: Self-pay | Admitting: *Deleted

## 2014-03-15 NOTE — Telephone Encounter (Signed)
I spoke to son Viviano Simasjay and stated I called the listed [employer] number but was kept on hold for 20 minutes without being able to resolve the issue. I offered to write a letter to [employer] stating that it  is medically necessary for the patient to be on brand name Aricept as he did not respond well to generic one in the past. He will call back and give us a fax number to send a letter to

## 2014-03-15 NOTE — Telephone Encounter (Signed)
Pt's son is calling back to provide a fax number so Dr. Pearlean BrownieSethi can write a letter stating he needs Aricept.  The fax is [phone]. The dept. is Coverage and Determination [facility]. Please call and advise.

## 2014-03-15 NOTE — Telephone Encounter (Signed)
Dr Pearlean BrownieSethi, fax number is [phone]

## 2014-03-15 NOTE — Telephone Encounter (Signed)
Patient son calling to state that a letter was received from [facility] stating that they will not continue Aricept 10 mg, until they speak with MD explaining why patient needs this medication. States that they received a temporary supply of medication but before they can receive any more MD must call [employer] at [phone].

## 2014-03-21 ENCOUNTER — Encounter: Payer: Self-pay | Admitting: *Deleted

## 2014-03-23 NOTE — Telephone Encounter (Signed)
Clairreaser with [employer] is calling to get additional information regarding approval for Aricept for patient. [employer] needs to know the  diagnosis, directions for medication and if patient has tried an alternate medication Galantamine or Rivastigmine. Reference Order Number [id number].  Please call and advise. Thank you.

## 2014-03-29 ENCOUNTER — Ambulatory Visit: Payer: Medicare Other | Admitting: Nurse Practitioner

## 2014-03-31 NOTE — Telephone Encounter (Signed)
Info has been provided to ins.  Request is currently under review.

## 2014-03-31 NOTE — Telephone Encounter (Signed)
Shanda BumpsJessica is this something that you can handle

## 2014-04-05 ENCOUNTER — Other Ambulatory Visit: Payer: Self-pay | Admitting: Nurse Practitioner

## 2014-04-05 ENCOUNTER — Ambulatory Visit: Payer: Medicare Other | Admitting: Neurology

## 2014-04-18 ENCOUNTER — Ambulatory Visit: Payer: Medicare Other | Admitting: Neurology

## 2014-04-20 ENCOUNTER — Encounter: Payer: Self-pay | Admitting: Neurology

## 2014-04-20 ENCOUNTER — Ambulatory Visit (INDEPENDENT_AMBULATORY_CARE_PROVIDER_SITE_OTHER): Payer: Medicare Other | Admitting: Neurology

## 2014-04-20 VITALS — BP 132/64 | HR 68 | Ht 65.0 in | Wt 180.4 lb

## 2014-04-20 DIAGNOSIS — R451 Restlessness and agitation: Secondary | ICD-10-CM

## 2014-04-20 MED ORDER — DIVALPROEX SODIUM ER 500 MG PO TB24: 500.0000 mg | ORAL_TABLET | Freq: Every day | ORAL | Status: DC

## 2014-04-20 NOTE — Patient Instructions (Signed)
I had along d/w patient`s wife and son in law about his recent agitation, restless ness and depression and discussed behavioural treatment as well as medication options and receommend a trial of depakote ER 500 mg daily. He was also encouraged to attend adult enrichment center for longer hours as well to reduce caregiver burn out for his wife.Continue aricept and namenda in current dosages as unfortunately he has not tolerated increases in their doses in the past. Return for f/u in 3 months or call earlier if necessary.

## 2014-04-20 NOTE — Progress Notes (Signed)
PATIENT: David [name] DOB: 07-Nov-1926   REASON FOR VISIT: follow up for dementia HISTORY FROM: patient  HISTORY OF PRESENT ILLNESS: 81 year old patient with mild dementia x2 years etiology unclear-senile dementia versus early Alzheimer's. He appears clinically stable on objective testing the family feels is slightly worse.  01/15/12 (PS):  He returns for followup after last visit on 10/17/2010. Patient's son note they have noted progressive cognitive decline over the last one year. At last visit I had suggested increasing Aricept to 23 mg which he could not tolerate and reduced the dose back to 10 mg within a week. He is now having difficulty with completing sentences and word finding and at times forgets names of family members. He also has good and bad days. He at times is confused and disoriented. He has not had any significant difficulties with walking or falls. He remains on Aricept 10 mg daily as well as Namenda twice a day and fish oil. He keeps himself busy and mentally challenging tasks across her puzzles, [employer] and even [email or web address] but he has been needing more supervision. He is complaining of mild headache today and states he is having a bad day it did not do well on the Mini-Mental testing and scored only 13/30 with my medical assistant. After I administered the test prostrating the questions to him he is slightly better and scored 15/30 but clearly stated that he knew the answers but could not come up with him today.  UPDATE 03/24/13 (LL): Patient returns for a yearly followup visit his last visit was 01/15/2012.  He is accompanied by his daughter-in-law today.  His memory appears stable, MMSE score today is 18/30.  He has significant difficulty completing sentences and finding words that he wants to say.  He is in good mood and daughter-in-law states that he normally is. Patient has no complaints today.  He is tolerating Aricept 10 mg daily and Namenda 10 mg twice a day well with  no known side effects. UPDATE 12/30/2013 (PS) :he returns for follow-up accompanied by his son. He seems to be doing about the same. He had a recent trip to [place] with his wife for a month to spend time with his daughters. Continues to have memory and cognitive difficulties which are essentially unchanged. He has occasional time finding words and completing sentences. Is usually in a good mood. He is tolerating Aricept 10 mg daily but for some reason is on Namenda 10 mg once a day only. He is having no GI or CNS side effects. He has had trouble tolerating the higher dose of Aricept and Namenda XR in the past.18/30 which is unchanged from last visit Update 04/20/2014 : He returns for follow-up today accompanied by his wife and son-in-law after last visit 3 months ago. The family is concerned about increased agitation, irritability, confusion and disorientation. He was previously going to adult enrichment Center for 3-4 hours twice a week but recently he has been reluctant to do so and wants to go home after only a couple of hours. The wife states that she is at times finding it difficult to redirect him and tell him. He tends to reminiscent about his past and at times talks about his parents who were there been long dead. There are no concerns about violence, fall risk, gait or balance problems. He remains on Aricept 10 mg daily and Namenda 10 mg twice daily. He has not been able to tolerate higher doses of both medications in the past. Patient has  previously not been diagnosed with depression but on today's testing he scored 8 on the geriatric depression scale. His Mini-Mental status score today is 17/30 which is stable from last visit. He has had no other new health problems since her last visit. REVIEW OF SYSTEMS: Full 14 system review of systems performed and notable only for:  Activity change, cough, shortness of breath, cold intolerance, incontinence of bladder, walking difficulty, snoring, memory loss,  agitation, confusion and anxiety and all other systems negative ALLERGIES: Allergies  Allergen Reactions  . Coreg [Carvedilol]     bradycardia  . Norvasc [Amlodipine Besylate]     swelling    HOME MEDICATIONS: Outpatient Prescriptions Prior to Visit  Medication Sig Dispense Refill  . aspirin 81 MG tablet Take 81 mg by mouth daily.    Marland Kitchen. atorvastatin (LIPITOR) 10 MG tablet Take 10 mg by mouth daily.    . B Complex-C (SUPER B COMPLEX PO) Take by mouth. daily    . brimonidine (ALPHAGAN) 0.15 % ophthalmic solution Place 1 drop into both eyes 3 (three) times daily.     . Cholecalciferol (VITAMIN D-3) 1000 UNITS CAPS Take 1,000 Units by mouth daily.    Marland Kitchen. donepezil (ARICEPT) 10 MG tablet TAKE 1 TABLET BY MOUTH EVERY DAY 90 tablet 1  . fish oil-omega-3 fatty acids 1000 MG capsule Take 1 g by mouth daily.     . folic acid (FOLVITE) 1 MG tablet Take 1 mg by mouth daily.    . furosemide (LASIX) 20 MG tablet TAKE 1 TABLET BY MOUTH TWICE DAILY 180 tablet 0  . isosorbide mononitrate (IMDUR) 30 MG 24 hr tablet TAKE 1 TABLET BY MOUTH EVERY DAY 30 tablet 11  . memantine (NAMENDA) 10 MG tablet Take 1 tablet (10 mg total) by mouth 2 (two) times daily. 60 tablet 3  . NITROSTAT 0.6 MG SL tablet Take 0.6 mg by mouth every 5 (five) minutes as needed for chest pain.     Marland Kitchen. omeprazole (PRILOSEC) 20 MG capsule Take 20 mg by mouth daily.    . tamsulosin (FLOMAX) 0.4 MG CAPS capsule Take 0.8 mg by mouth every evening.      No facility-administered medications prior to visit.    PAST MEDICAL HISTORY: Past Medical History  Diagnosis Date  . Hyperlipemia   . Alzheimer disease   . High cholesterol   . Prostate disease   . Macular degeneration   . Glaucoma   . Memory deficit   . Low back pain   . Colon polyp 2007  . Elevated PSA     4-7 in 2009  . Hypercholesteremia   . Urinary incontinence   . CKD (chronic kidney disease), stage III   . Hypertension     grade 1 diastolic dysfunction, trace MR and TR,  mild LVH Echo 3-/4    PAST SURGICAL HISTORY: Past Surgical History  Procedure Laterality Date  . Gallbladder surgery  1996    FAMILY HISTORY: Family History  Problem Relation Age of Onset  . Cancer Daughter     BREAST  . Dementia Mother     SOCIAL HISTORY: History   Social History  . Marital Status: Married    Spouse Name: Myna HidalgoKarma  . Number of Children: 4  . Years of Education: College   Occupational History  . retired    Social History Main Topics  . Smoking status: Never Smoker   . Smokeless tobacco: Never Used  . Alcohol Use: No  . Drug Use: No  .  Sexual Activity: Not on file   Other Topics Concern  . Not on file   Social History Narrative   Patient is married with 4 children.   Patient is right handed.   Patient has college education.   Caffeine Use: 3 cups daily      PHYSICAL EXAM  Filed Vitals:   04/20/14 1608  BP: 132/64  Pulse: 68  Height:  (1.651 m)  Weight: 180 lb 6.4 oz (81.829 kg)   Body mass index is 30.02 kg/(m^2).  Generalized: pleasant elderly [place] male, in no acute distress, pleasant elderly [place] male. Head: normocephalic and atraumatic.   Neck: Supple, no carotid bruits  Cardiac: Regular rate rhythm, no murmur  Musculoskeletal: small sebaceous cyst on the forehead in midline  Neurological examination  Mentation: Alert oriented to time, place,  . Follows one-step commands, has difficulty with some two-step commands speech is clear but nonfluent. MMSE 17/30 with 3 deficits in orientation,attention, 0/3 delayed recall, comprehension. AFT 0 only. GDS 8.  Clock drawing 3/4  Palmomental reflexes present. Snout absent. Cranial nerve II-XII:  Pupils were equal round reactive to light extraocular movements were full, visual field were full on confrontational test. Facial sensation and strength were normal. hearing was intact to finger rubbing bilaterally. Uvula tongue midline. head turning and shoulder shrug and  were normal and symmetric.Tongue protrusion into cheek strength was normal. Motor: normal bulk and tone, full strength in the BUE, BLE, fine finger movements normal, no pronator drift. No focal weakness Sensory: normal and symmetric to light touch Coordination: finger-nose-finger, heel-to-shin bilaterally, no dysmetria Reflexes:  Deep tendon reflexes in the upper and lower extremities are present and symmetric.  Gait and Station: Steady but broad-based gait with leaning to left and unable to do tandem walking; no festination; minimal apraxia while turning.   ASSESSMENT AND PLAN 81 y.o. year old male  has a past medical history of Hyperlipemia; High cholesterol; Prostate disease; Macular degeneration; and Glaucoma here with progressive dementia times 3-4 years etiology now likely moderate Alzheimer's.. No new concerns about agitation and irritability likely from underlying untreated depression  PLAN: I had along d/w patient`s wife and son in law about his recent agitation, restless ness and depression and discussed behavioural treatment as well as medication options and receommend a trial of depakote ER 500 mg daily. He was also encouraged to attend adult enrichment center for longer hours as well to reduce caregiver burn out for his wife.Continue aricept and namenda in current dosages as unfortunately he has not tolerated increases in their doses in the past. Return for f/u in 3 months or call earlier if necessary.   Meds ordered this encounter  Medications  . divalproex (DEPAKOTE ER) 500 MG 24 hr tablet    Sig: Take 1 tablet (500 mg total) by mouth daily.    Dispense:  60 tablet    Refill:  1   Return in about 3 months (around 07/21/2014).  Delia Heady, MD  04/20/2014, 10:36 PM [facility] [address], [address] [place], [place] [address] [phone]  Note: This document was prepared with digital dictation and possible smart phrase technology. Any transcriptional errors  that result from this process are unintentional.

## 2014-04-29 ENCOUNTER — Other Ambulatory Visit: Payer: Self-pay | Admitting: Interventional Cardiology

## 2014-06-29 ENCOUNTER — Telehealth: Payer: Self-pay | Admitting: Neurology

## 2014-06-29 ENCOUNTER — Ambulatory Visit: Payer: Self-pay | Admitting: Neurology

## 2014-06-29 NOTE — Telephone Encounter (Signed)
Erin from [facility] [phone]) called and requested to speak with someone regarding the patients medication Rx. memantine (NAMENDA) 10 MG tablet. Please call and advise.

## 2014-06-29 NOTE — Telephone Encounter (Signed)
I called back.  Spoke with Barnes & NobleErin.  She wanted to clarify if the patient is taking Namenda.  Advised per last OV note, our records show they are taking Namenda.  She has noted the patient's file and will call us back if anything further is needed.

## 2014-07-08 ENCOUNTER — Telehealth: Payer: Self-pay | Admitting: Neurology

## 2014-07-08 NOTE — Telephone Encounter (Signed)
Dr. Salem Casterixit (daughter) called regarding her father.  She did not want to go into detail.  Would like Dr. Pearlean BrownieSethi to call her @ [phone]. Please call and advise.

## 2014-07-08 NOTE — Telephone Encounter (Signed)
Called Dr Salem Casterixit who stated she wanted to discuss possible medications for patient with Dr Pearlean BrownieSethi. Informed her he will return to this office on 07/13/14 and will route her request to him. She stated "It is not urgent." She verbalized understanding and appreciation.

## 2014-07-09 ENCOUNTER — Telehealth: Payer: Self-pay | Admitting: Neurology

## 2014-07-09 MED ORDER — QUETIAPINE FUMARATE 25 MG PO TABS: ORAL_TABLET | ORAL | Status: DC

## 2014-07-09 NOTE — Telephone Encounter (Signed)
His son called on call, his father has increased confusion, agitations now, he was seen by Dr. Gershon CraneSeith March 2nd,started on Depakote 500mg  qhs, but he never tried it. "it is too strong" after his son consult his physician relatives.  I called in Seroquel 25mg  1/2 to one tab po qday.

## 2014-07-13 NOTE — Telephone Encounter (Signed)
I spoke to the patient's daughter about his recent agitation issues over the weekend which seemed to have improved on low-dose Seroquel which was started and I agree with continuing that

## 2014-07-27 ENCOUNTER — Ambulatory Visit: Payer: Medicare Other | Admitting: Neurology

## 2014-08-16 ENCOUNTER — Other Ambulatory Visit: Payer: Self-pay | Admitting: Interventional Cardiology

## 2014-08-31 ENCOUNTER — Ambulatory Visit (INDEPENDENT_AMBULATORY_CARE_PROVIDER_SITE_OTHER): Payer: Medicare Other | Admitting: Neurology

## 2014-08-31 ENCOUNTER — Telehealth: Payer: Self-pay | Admitting: Neurology

## 2014-08-31 ENCOUNTER — Encounter: Payer: Self-pay | Admitting: Neurology

## 2014-08-31 VITALS — BP 145/62 | HR 74 | Ht 65.0 in | Wt 175.2 lb

## 2014-08-31 DIAGNOSIS — G309 Alzheimer's disease, unspecified: Secondary | ICD-10-CM | POA: Diagnosis not present

## 2014-08-31 DIAGNOSIS — F0391 Unspecified dementia with behavioral disturbance: Secondary | ICD-10-CM | POA: Diagnosis not present

## 2014-08-31 DIAGNOSIS — R451 Restlessness and agitation: Secondary | ICD-10-CM | POA: Diagnosis not present

## 2014-08-31 DIAGNOSIS — F0280 Dementia in other diseases classified elsewhere without behavioral disturbance: Secondary | ICD-10-CM | POA: Insufficient documentation

## 2014-08-31 DIAGNOSIS — F0390 Unspecified dementia without behavioral disturbance: Secondary | ICD-10-CM | POA: Insufficient documentation

## 2014-08-31 NOTE — Patient Instructions (Signed)
-   continue seroquel for behavior disturbance.  - adjust seroquel dose according to pt symptoms. If getting worse at night, can increas seroquel to one tablet 25mg  at night. If worse during the day, can start seroquel in the morning but start with low dose and titrate up. Side effect most commonly is sedation. - contact PCP if pt needs home nursing help. - recommend to follow up every 3-4 months, preferably with Dr. Pearlean BrownieSethi. - continue other medications - follow up with Dr. Pearlean BrownieSethi in 3 months.

## 2014-08-31 NOTE — Telephone Encounter (Addendum)
Closed telephone by mistake. Sent to Dr. Roda ShuttersXu.

## 2014-08-31 NOTE — Telephone Encounter (Signed)
Patient's daughter is calling because the patient is getting more anxious and agitated.. Patient's daughter is very concerned and states the patient needs to be seen sooner that August and does not want to see a NP.  If unable to reach patient's daughter please call the patient's son David [name] at [phone] to discuss. Thank you.

## 2014-08-31 NOTE — Progress Notes (Signed)
PATIENT: David [name] DOB: 03-12-1926   REASON FOR VISIT: follow up for dementia HISTORY FROM: daughter  HISTORY OF PRESENT ILLNESS: 81 year old patient with mild dementia x2 years etiology unclear-senile dementia versus early Alzheimer's. He appears clinically stable on objective testing the family feels is slightly worse.  01/15/12 (PS):  He returns for followup after last visit on 10/17/2010. Patient's son note they have noted progressive cognitive decline over the last one year. At last visit I had suggested increasing Aricept to 23 mg which he could not tolerate and reduced the dose back to 10 mg within a week. He is now having difficulty with completing sentences and word finding and at times forgets names of family members. He also has good and bad days. He at times is confused and disoriented. He has not had any significant difficulties with walking or falls. He remains on Aricept 10 mg daily as well as Namenda twice a day and fish oil. He keeps himself busy and mentally challenging tasks across her puzzles, [employer] and even [email or web address] but he has been needing more supervision. He is complaining of mild headache today and states he is having a bad day it did not do well on the Mini-Mental testing and scored only 13/30 with my medical assistant. After I administered the test prostrating the questions to him he is slightly better and scored 15/30 but clearly stated that he knew the answers but could not come up with him today.  UPDATE 03/24/13 (LL): Patient returns for a yearly followup visit his last visit was 01/15/2012.  He is accompanied by his daughter-in-law today.  His memory appears stable, MMSE score today is 18/30.  He has significant difficulty completing sentences and finding words that he wants to say.  He is in good mood and daughter-in-law states that he normally is. Patient has no complaints today.  He is tolerating Aricept 10 mg daily and Namenda 10 mg twice a day well  with no known side effects.  UPDATE 12/30/2013 (PS) :he returns for follow-up accompanied by his son. He seems to be doing about the same. He had a recent trip to [place] with his wife for a month to spend time with his daughters. Continues to have memory and cognitive difficulties which are essentially unchanged. He has occasional time finding words and completing sentences. Is usually in a good mood. He is tolerating Aricept 10 mg daily but for some reason is on Namenda 10 mg once a day only. He is having no GI or CNS side effects. He has had trouble tolerating the higher dose of Aricept and Namenda XR in the past.18/30 which is unchanged from last visit  Update 04/20/2014 : He returns for follow-up today accompanied by his wife and son-in-law after last visit 3 months ago. The family is concerned about increased agitation, irritability, confusion and disorientation. He was previously going to adult enrichment Center for 3-4 hours twice a week but recently he has been reluctant to do so and wants to go home after only a couple of hours. The wife states that she is at times finding it difficult to redirect him and tell him. He tends to reminiscent about his past and at times talks about his parents who were there been long dead. There are no concerns about violence, fall risk, gait or balance problems. He remains on Aricept 10 mg daily and Namenda 10 mg twice daily. He has not been able to tolerate higher doses of both medications in the past.  Patient has previously not been diagnosed with depression but on today's testing he scored 8 on the geriatric depression scale. His Mini-Mental status score today is 17/30 which is stable from last visit. He has had no other new health problems since her last visit.  Update 08/31/14: Patient returns for follow-up today accompanied by his daughter. The family continued have concerns about increased agitation, irritability, confusion and disorientation. He was prescribed  with depakote at last visit. However, according to Dr. Zannie Cove note, pt never took it as "it is too strong". He was prescribed with seroquel and family gives him 12.5mg  every night. He seems responding to medication well and the agitation issues seems to improved. He normally has sun downing in the evenings, and seroquel helps. However, recently, he has 2-3 episodes of agitation, confusion in the afternoon. For example, he closes all the curtains, shut the door in the afternoon, saying he has to go attend meeting and people is waiting for him, which is unusual for him that early during the day. Family concerned about the progression and asked earlier follow up in clinic. Had MMSE testing in clinic which is 8/30. However, in clinic today, pt is calm and polite, and cooperative.   REVIEW OF SYSTEMS: Full 14 system review of systems performed and notable only for:  Activity change, cough, shortness of breath, cold intolerance, incontinence of bladder, walking difficulty, snoring, memory loss, agitation, confusion and anxiety and all other systems negative ALLERGIES: Allergies  Allergen Reactions  . Coreg [Carvedilol]     bradycardia  . Norvasc [Amlodipine Besylate]     swelling    HOME MEDICATIONS: Outpatient Prescriptions Prior to Visit  Medication Sig Dispense Refill  . aspirin 81 MG tablet Take 81 mg by mouth daily.    Marland Kitchen atorvastatin (LIPITOR) 10 MG tablet Take 10 mg by mouth daily.    . B Complex-C (SUPER B COMPLEX PO) Take by mouth. daily    . brimonidine (ALPHAGAN) 0.15 % ophthalmic solution Place 1 drop into both eyes 3 (three) times daily.     . Cholecalciferol (VITAMIN D-3) 1000 UNITS CAPS Take 1,000 Units by mouth daily.    Marland Kitchen donepezil (ARICEPT) 10 MG tablet TAKE 1 TABLET BY MOUTH EVERY DAY 90 tablet 1  . fish oil-omega-3 fatty acids 1000 MG capsule Take 1 g by mouth daily.     . folic acid (FOLVITE) 1 MG tablet Take 1 mg by mouth daily.    . furosemide (LASIX) 20 MG tablet TAKE 1 TABLET  BY MOUTH TWICE DAILY 180 tablet 0  . isosorbide mononitrate (IMDUR) 30 MG 24 hr tablet TAKE 1 TABLET BY MOUTH EVERY DAY 30 tablet 11  . memantine (NAMENDA) 10 MG tablet Take 1 tablet (10 mg total) by mouth 2 (two) times daily. 60 tablet 3  . NITROSTAT 0.6 MG SL tablet Take 0.6 mg by mouth every 5 (five) minutes as needed for chest pain.     Marland Kitchen omeprazole (PRILOSEC) 20 MG capsule Take 20 mg by mouth daily.    . QUEtiapine (SEROQUEL) 25 MG tablet 1/2 to one tab po qhs. 30 tablet 3  . tamsulosin (FLOMAX) 0.4 MG CAPS capsule Take 0.8 mg by mouth every evening.     . divalproex (DEPAKOTE ER) 500 MG 24 hr tablet Take 1 tablet (500 mg total) by mouth daily. (Patient not taking: Reported on 08/31/2014) 60 tablet 1   No facility-administered medications prior to visit.    PAST MEDICAL HISTORY: Past Medical History  Diagnosis  Date  . Hyperlipemia   . Alzheimer disease   . High cholesterol   . Prostate disease   . Macular degeneration   . Glaucoma   . Memory deficit   . Low back pain   . Colon polyp 2007  . Elevated PSA     4-7 in 2009  . Hypercholesteremia   . Urinary incontinence   . CKD (chronic kidney disease), stage III   . Hypertension     grade 1 diastolic dysfunction, trace MR and TR, mild LVH Echo 3-/4    PAST SURGICAL HISTORY: Past Surgical History  Procedure Laterality Date  . Gallbladder surgery  1996    FAMILY HISTORY: Family History  Problem Relation Age of Onset  . Cancer Daughter     BREAST  . Dementia Mother     SOCIAL HISTORY: History   Social History  . Marital Status: Married    Spouse Name: Myna Hidalgo  . Number of Children: 4  . Years of Education: College   Occupational History  . retired    Social History Main Topics  . Smoking status: Never Smoker   . Smokeless tobacco: Never Used  . Alcohol Use: No  . Drug Use: No  . Sexual Activity: Not on file   Other Topics Concern  . Not on file   Social History Narrative   Patient is married with 4  children.   Patient is right handed.   Patient has college education.   Caffeine Use: 3 cups daily      PHYSICAL EXAM  Filed Vitals:   08/31/14 1517  BP: 145/62  Pulse: 74  Height: 5\' 5"  (1.651 m)  Weight: 175 lb 3.2 oz (79.47 kg)   Body mass index is 29.15 kg/(m^2).  Generalized: pleasant elderly [place] male, in no acute distress, pleasant elderly [place] male. Head: normocephalic and atraumatic.   Neck: Supple, no carotid bruits  Cardiac: Regular rate rhythm, no murmur  Musculoskeletal: small sebaceous cyst on the forehead in midline  Neurological examination  Mentation: Alert oriented to time, place. Follows one-step commands, has difficulty with some two-step commands speech is clear but nonfluent. Palmomental reflexes present. Snout absent.  mini-mental status exam Orientation to time - 0/5 Orientation to place - 0/5 Registration - 3/3 Attention - 1/5 Delayed recall - 0/3 Naming - 1/2 Repetition - 0/1 Comprehension - 1/3 Reading - 1/1 Writing - 0/1 Visuospatial - 1/1  Total 8/30  Cranial nerve II-XII:  Pupils were equal round reactive to light extraocular movements were full, visual field were full on confrontational test. Facial sensation and strength were normal. hearing was intact to finger rubbing bilaterally. Uvula tongue midline. head turning and shoulder shrug and were normal and symmetric.Tongue protrusion into cheek strength was normal. Motor: normal bulk and tone, full strength in the BUE, BLE, fine finger movements normal, no pronator drift. No focal weakness Sensory: normal and symmetric to light touch Coordination: finger-nose-finger, heel-to-shin bilaterally, no dysmetria Reflexes:  Deep tendon reflexes in the upper and lower extremities are present and symmetric.  Gait and Station: Steady but broad-based gait with leaning to left and unable to do tandem walking; no festination; minimal apraxia while turning.   ASSESSMENT AND  PLAN 81 y.o. year old male  has a past medical history of Hyperlipemia; High cholesterol; Prostate disease; Macular degeneration; and Glaucoma here with progressive dementia times 3-4 years etiology now likely advanced Alzheimer's with behavior disturbance. Responded to low dose seroquel at night but developed  some new episode of agitation and confusion during the day. Discussed with family regarding seroquel dose adjustment depending on pt symptoms.  PLAN: - continue seroquel for behavior disturbance.  - adjust seroquel dose according to pt symptoms. If getting worse at night, can increas seroquel to one tablet 25mg  at night. If worse during the day, can start seroquel in the morning but start with low dose and titrate up. Side effect most commonly is sedation. - contact PCP if pt needs home nursing help. - recommend to follow up every 3-4 months, preferably with Dr. Pearlean Brownie. - continue other medications - follow up with Dr. Pearlean Brownie in 3 months.   No orders of the defined types were placed in this encounter.   No Follow-up on file.  Marvel Plan, MD PhD Stroke Neurology 08/31/2014 6:26 PM

## 2014-09-12 ENCOUNTER — Other Ambulatory Visit: Payer: Self-pay | Admitting: Neurology

## 2014-09-23 ENCOUNTER — Ambulatory Visit: Payer: Self-pay | Admitting: Neurology

## 2014-10-03 ENCOUNTER — Other Ambulatory Visit: Payer: Self-pay | Admitting: Neurology

## 2014-10-04 ENCOUNTER — Ambulatory Visit (INDEPENDENT_AMBULATORY_CARE_PROVIDER_SITE_OTHER): Payer: Medicare Other | Admitting: Interventional Cardiology

## 2014-10-04 ENCOUNTER — Encounter: Payer: Self-pay | Admitting: Interventional Cardiology

## 2014-10-04 VITALS — BP 116/58 | HR 69 | Ht 67.0 in | Wt 175.8 lb

## 2014-10-04 DIAGNOSIS — I10 Essential (primary) hypertension: Secondary | ICD-10-CM | POA: Diagnosis not present

## 2014-10-04 DIAGNOSIS — R9430 Abnormal result of cardiovascular function study, unspecified: Secondary | ICD-10-CM | POA: Diagnosis not present

## 2014-10-04 NOTE — Progress Notes (Signed)
Patient ID: David [name], male   DOB: 06-04-1926, 81 y.o.   MRN: [id number]     Cardiology Office Note   Date:  10/04/2014   ID:  Bertram Haddix, DOB 12/05/1926, MRN [id number]  PCP:  Georgann Housekeeper, MD    No chief complaint on file.  follow-up coronary artery disease   Wt Readings from Last 3 Encounters:  10/04/14 175 lb 12.8 oz (79.742 kg)  08/31/14 175 lb 3.2 oz (79.47 kg)  04/20/14 180 lb 6.4 oz (81.829 kg)       History of Present Illness: David [name] is a 81 y.o. male   With dementia who had an abnormal stress test and anginal symptoms. Due to renal insufficiency, he has been managed medically.   He has multiple risk factors for heart disease.   He has not been walking much. He has some chest discomfort when he walks longer distances. His wife states that he is just getting more sedentary. His dementia is also progressing. He has not used any nitroglycerin under his tongue. He tries to stay out of the heat. He does not complain much. He had a frozen shoulder couple of weeks ago which he finally complained about after some time. This required an injection and resolved.   he still has occasional leg swelling. It's worse if he keeps his legs down. He does not elevate his legs at night.    Past Medical History  Diagnosis Date  . Hyperlipemia   . Alzheimer disease   . High cholesterol   . Prostate disease   . Macular degeneration   . Glaucoma   . Memory deficit   . Low back pain   . Colon polyp 2007  . Elevated PSA     4-7 in 2009  . Hypercholesteremia   . Urinary incontinence   . CKD (chronic kidney disease), stage III   . Hypertension     grade 1 diastolic dysfunction, trace MR and TR, mild LVH Echo 3-/4    Past Surgical History  Procedure Laterality Date  . Gallbladder surgery  1996     Current Outpatient Prescriptions  Medication Sig Dispense Refill  . ARICEPT 10 MG tablet TAKE 1 TABLET BY MOUTH DAILY 90 tablet 0  . aspirin 81 MG tablet Take 81 mg  by mouth daily.    Marland Kitchen atorvastatin (LIPITOR) 10 MG tablet Take 10 mg by mouth daily.    . B Complex-C (SUPER B COMPLEX PO) Take 1 tablet by mouth daily.     . brimonidine (ALPHAGAN) 0.15 % ophthalmic solution Place 1 drop into both eyes 3 (three) times daily.     . Cholecalciferol (VITAMIN D-3) 1000 UNITS CAPS Take 1,000 Units by mouth daily.    . fish oil-omega-3 fatty acids 1000 MG capsule Take 1 g by mouth daily.     . folic acid (FOLVITE) 1 MG tablet Take 1 mg by mouth daily.    . furosemide (LASIX) 20 MG tablet TAKE 1 TABLET BY MOUTH TWICE DAILY 180 tablet 0  . isosorbide mononitrate (IMDUR) 30 MG 24 hr tablet TAKE 1 TABLET BY MOUTH EVERY DAY 30 tablet 11  . NAMENDA 10 MG tablet TAKE 1 TABLET BY MOUTH TWICE DAILY 60 tablet 3  . NITROSTAT 0.6 MG SL tablet Place 0.6 mg under the tongue every 5 (five) minutes as needed for chest pain.     Marland Kitchen omeprazole (PRILOSEC) 20 MG capsule Take 20 mg by mouth daily.    . QUEtiapine (SEROQUEL) 25 MG tablet  Take 1/4 tablet by mouth in the morning and take 1/2 tablet by mouth in the evening    . tamsulosin (FLOMAX) 0.4 MG CAPS capsule Take 0.8 mg by mouth every evening.      No current facility-administered medications for this visit.    Allergies:   Coreg and Norvasc    Social History:  The patient  reports that he has never smoked. He has never used smokeless tobacco. He reports that he does not drink alcohol or use illicit drugs.   Family History:  The patient's family history includes Cancer in his daughter; Dementia in his mother.    ROS:  Please see the history of present illness.   Otherwise, review of systems are positive for  Leg swelling.   All other systems are reviewed and negative.    PHYSICAL EXAM: VS:  BP 116/58 mmHg  Pulse 69  Ht 5\' 7"  (1.702 m)  Wt 175 lb 12.8 oz (79.742 kg)  BMI 27.53 kg/m2 , BMI Body mass index is 27.53 kg/(m^2). GEN: Well nourished, well developed, in no acute distress HEENT: normal Neck: no JVD, carotid  bruits, or masses Cardiac: RRR; no murmurs, rubs, or gallops, mild pretibial edema  Respiratory:  clear to auscultation bilaterally, normal work of breathing GI: soft, nontender, nondistended, + BS MS: no deformity or atrophy Skin: warm and dry, no rash Neuro:  Strength and sensation are intact Psych: euthymic mood, full affect   EKG:   The ekg ordered today demonstrates  Normal sinus rhythm, PVCs, right bundle branch block   Recent Labs: 11/25/2013: BUN 24*; Creatinine, Ser 1.5; Potassium 3.6; Sodium 139   Lipid Panel No results found for: CHOL, TRIG, HDL, CHOLHDL, VLDL, LDLCALC, LDLDIRECT  .   ASSESSMENT AND PLAN:  1.  CAD/abnormal stress test: he is not limited by symptoms at this point , partially because he is not very active. Would not pursue any invasive testing at this time. We discussed possibly increasing the Imdur to 60 mg daily if he were to have more symptoms. At this point, the family  Feels that his angina is controlled. The patient is not complaining of anything. I encouraged him to try to walk at least 150 minutes a week. If angina does limit him, I  asked them to call the office and we will increase his Imdur to 60 mg daily. 2.  hyperlipidemia : Managed by Dr. Eula Listen. 3.  hypertension: Blood pressure well controlled. 4.  Chronic renal insufficiency: Managed by Dr. Eula Listen.   Current medicines are reviewed at length with the patient today.  The patient concerns regarding his medicines were addressed.  The following changes have been made:  No change  Labs/ tests ordered today include: Orders Placed This Encounter  Procedures  . EKG 12-Lead    Recommend 150 minutes/week of aerobic exercise Low fat, low carb, high fiber diet recommended  Disposition:   FU in 1 year   Delorise Jackson., MD  10/04/2014 6:04 PM    [facility] [address] [place], [place], [place]  [address] Phone: [phone]; Fax: [phone]

## 2014-10-04 NOTE — Patient Instructions (Addendum)
Medication Instructions:  Same-No change  Labwork: None  Testing/Procedures: None  Follow-Up: Your physician wants you to follow-up in: 1 year. You will receive a reminder letter in the mail two months in advance. If you don't receive a letter, please call our office to schedule the follow-up appointment.   Please call the office at [phone] if you are having chest pain. If office is closed please call 911 or have someone drive you to closest ER or Urgent care.

## 2014-10-06 ENCOUNTER — Ambulatory Visit: Payer: Medicare Other | Admitting: Neurology

## 2014-10-27 ENCOUNTER — Telehealth: Payer: Self-pay | Admitting: Neurology

## 2014-10-27 ENCOUNTER — Other Ambulatory Visit: Payer: Self-pay | Admitting: Interventional Cardiology

## 2014-10-27 MED ORDER — ARICEPT 10 MG PO TABS: 10.0000 mg | ORAL_TABLET | Freq: Every day | ORAL | Status: DC

## 2014-10-27 NOTE — Telephone Encounter (Signed)
It appear a Rx was sent for 90 days last month, however, Rx has been resent for brand name only.  I called the pharmacy.  They verified they did receive the Rx and it goes through for $3.60 co-pay.  I called back to advise.  Got no answer.  Left message.

## 2014-10-27 NOTE — Telephone Encounter (Signed)
Patients daughter called stating he needs refill for ARICEPT 10 MG tablet . She states is needs brand only. Generic does not work for him. He has 2 days left of medication. Please call and advise. She can be reached at [phone].

## 2014-11-04 ENCOUNTER — Other Ambulatory Visit: Payer: Self-pay | Admitting: Neurology

## 2014-11-15 ENCOUNTER — Other Ambulatory Visit: Payer: Self-pay | Admitting: Interventional Cardiology

## 2014-11-17 ENCOUNTER — Other Ambulatory Visit: Payer: Self-pay | Admitting: *Deleted

## 2014-11-17 MED ORDER — FUROSEMIDE 20 MG PO TABS: 20.0000 mg | ORAL_TABLET | Freq: Two times a day (BID) | ORAL | Status: DC

## 2014-11-21 ENCOUNTER — Other Ambulatory Visit: Payer: Self-pay | Admitting: Interventional Cardiology

## 2014-11-21 MED ORDER — ISOSORBIDE MONONITRATE ER 30 MG PO TB24: 30.0000 mg | ORAL_TABLET | Freq: Every day | ORAL | Status: DC

## 2014-12-15 ENCOUNTER — Encounter: Payer: Self-pay | Admitting: Neurology

## 2014-12-15 ENCOUNTER — Ambulatory Visit (INDEPENDENT_AMBULATORY_CARE_PROVIDER_SITE_OTHER): Payer: Medicare Other | Admitting: Neurology

## 2014-12-15 VITALS — BP 120/64 | HR 77 | Ht 67.0 in | Wt 182.8 lb

## 2014-12-15 DIAGNOSIS — G301 Alzheimer's disease with late onset: Secondary | ICD-10-CM

## 2014-12-15 DIAGNOSIS — F0281 Dementia in other diseases classified elsewhere with behavioral disturbance: Secondary | ICD-10-CM | POA: Diagnosis not present

## 2014-12-15 MED ORDER — FUROSEMIDE 20 MG PO TABS: 20.0000 mg | ORAL_TABLET | Freq: Every day | ORAL | Status: DC

## 2014-12-15 NOTE — Progress Notes (Signed)
PATIENT: David [name] DOB: 03-12-1926   REASON FOR VISIT: follow up for dementia HISTORY FROM: daughter  HISTORY OF PRESENT ILLNESS: 81 year old patient with mild dementia x2 years etiology unclear-senile dementia versus early Alzheimer's. He appears clinically stable on objective testing the family feels is slightly worse.  01/15/12 (PS):  He returns for followup after last visit on 10/17/2010. Patient's son note they have noted progressive cognitive decline over the last one year. At last visit I had suggested increasing Aricept to 23 mg which he could not tolerate and reduced the dose back to 10 mg within a week. He is now having difficulty with completing sentences and word finding and at times forgets names of family members. He also has good and bad days. He at times is confused and disoriented. He has not had any significant difficulties with walking or falls. He remains on Aricept 10 mg daily as well as Namenda twice a day and fish oil. He keeps himself busy and mentally challenging tasks across her puzzles, [employer] and even [email or web address] but he has been needing more supervision. He is complaining of mild headache today and states he is having a bad day it did not do well on the Mini-Mental testing and scored only 13/30 with my medical assistant. After I administered the test prostrating the questions to him he is slightly better and scored 15/30 but clearly stated that he knew the answers but could not come up with him today.  UPDATE 03/24/13 (LL): Patient returns for a yearly followup visit his last visit was 01/15/2012.  He is accompanied by his daughter-in-law today.  His memory appears stable, MMSE score today is 18/30.  He has significant difficulty completing sentences and finding words that he wants to say.  He is in good mood and daughter-in-law states that he normally is. Patient has no complaints today.  He is tolerating Aricept 10 mg daily and Namenda 10 mg twice a day well  with no known side effects.  UPDATE 12/30/2013 (PS) :he returns for follow-up accompanied by his son. He seems to be doing about the same. He had a recent trip to [place] with his wife for a month to spend time with his daughters. Continues to have memory and cognitive difficulties which are essentially unchanged. He has occasional time finding words and completing sentences. Is usually in a good mood. He is tolerating Aricept 10 mg daily but for some reason is on Namenda 10 mg once a day only. He is having no GI or CNS side effects. He has had trouble tolerating the higher dose of Aricept and Namenda XR in the past.18/30 which is unchanged from last visit  Update 04/20/2014 : He returns for follow-up today accompanied by his wife and son-in-law after last visit 3 months ago. The family is concerned about increased agitation, irritability, confusion and disorientation. He was previously going to adult enrichment Center for 3-4 hours twice a week but recently he has been reluctant to do so and wants to go home after only a couple of hours. The wife states that she is at times finding it difficult to redirect him and tell him. He tends to reminiscent about his past and at times talks about his parents who were there been long dead. There are no concerns about violence, fall risk, gait or balance problems. He remains on Aricept 10 mg daily and Namenda 10 mg twice daily. He has not been able to tolerate higher doses of both medications in the past.  Patient has previously not been diagnosed with depression but on today's testing he scored 8 on the geriatric depression scale. His Mini-Mental status score today is 17/30 which is stable from last visit. He has had no other new health problems since her last visit.  Update 08/31/14: Patient returns for follow-up today accompanied by his daughter. The family continued have concerns about increased agitation, irritability, confusion and disorientation. He was prescribed  with depakote at last visit. However, according to Dr. Zannie CoveYan's note, pt never took it as "it is too strong". He was prescribed with seroquel and family gives him 12.5mg  every night. He seems responding to medication well and the agitation issues seems to improved. He normally has sun downing in the evenings, and seroquel helps. However, recently, he has 2-3 episodes of agitation, confusion in the afternoon. For example, he closes all the curtains, shut the door in the afternoon, saying he has to go attend meeting and people is waiting for him, which is unusual for him that early during the day. Family concerned about the progression and asked earlier follow up in clinic. Had MMSE testing in clinic which is 8/30. However, in clinic today, pt is calm and polite, and cooperative.  Update 12/15/2014 : He returns for follow-up today after last visit with Dr. Roda ShuttersXu 3 months ago. He was having issues with agitation during the day at that visit and that seems to have improved after low dose Seroquel 12.5 mg was used as needed during the daytime. He continues to take 25 mg at night which seems to help. His been having some intermittent issues with incontinence particularly at night. He has been taking Lasix 20 mg twice daily for leg swelling. He does go to [facility] twice a week but usually gets tired than a few hours and call us to be brought home. He does have a caregiver who comes for a few hours. Patient's family continues to take excellent care of him at home. He has not been able to tolerate higher doses of Aricept and Namenda in the past but remains on Aricept 10 mg daily and Namenda 10 g twice daily which is tolerating well. He has had no major falls but does need little help and direction with getting up and walking. There have been no delusions or hallucinations and wandering or unsafe behavior. She does continue to get confused and have poor short-term memory and attention. The family feels gradually they have seen a  decline over the last 1 year. REVIEW OF SYSTEMS: Full 14 system review of systems performed and notable only for: Memory loss, confusion, walking difficulty, leg swelling, incontinence and all other systems negative ALLERGIES: Allergies  Allergen Reactions  . Coreg [Carvedilol]     bradycardia  . Norvasc [Amlodipine Besylate]     swelling    HOME MEDICATIONS: Outpatient Prescriptions Prior to Visit  Medication Sig Dispense Refill  . ARICEPT 10 MG tablet Take 1 tablet (10 mg total) by mouth daily. 90 tablet 1  . aspirin 81 MG tablet Take 81 mg by mouth daily.    Marland Kitchen. atorvastatin (LIPITOR) 10 MG tablet Take 10 mg by mouth daily.    . B Complex-C (SUPER B COMPLEX PO) Take 1 tablet by mouth daily.     . brimonidine (ALPHAGAN) 0.15 % ophthalmic solution Place 1 drop into both eyes 3 (three) times daily.     . Cholecalciferol (VITAMIN D-3) 1000 UNITS CAPS Take 1,000 Units by mouth daily.    . fish oil-omega-3 fatty  acids 1000 MG capsule Take 1 g by mouth daily.     . folic acid (FOLVITE) 1 MG tablet Take 1 mg by mouth daily.    . isosorbide mononitrate (IMDUR) 30 MG 24 hr tablet Take 1 tablet (30 mg total) by mouth daily. 90 tablet 2  . NAMENDA 10 MG tablet TAKE 1 TABLET BY MOUTH TWICE DAILY 60 tablet 3  . NITROSTAT 0.6 MG SL tablet Place 0.6 mg under the tongue every 5 (five) minutes as needed for chest pain.     Marland Kitchen omeprazole (PRILOSEC) 20 MG capsule Take 20 mg by mouth daily.    . QUEtiapine (SEROQUEL) 25 MG tablet TAKE 1/2 TO 1 TABLET BY MOUTH EVERY NIGHT AT BEDTIME 90 tablet 0  . tamsulosin (FLOMAX) 0.4 MG CAPS capsule Take 0.8 mg by mouth every evening.     . furosemide (LASIX) 20 MG tablet Take 1 tablet (20 mg total) by mouth 2 (two) times daily. 180 tablet 2   No facility-administered medications prior to visit.    PAST MEDICAL HISTORY: Past Medical History  Diagnosis Date  . Hyperlipemia   . Alzheimer disease   . High cholesterol   . Prostate disease   . Macular degeneration     . Glaucoma   . Memory deficit   . Low back pain   . Colon polyp 2007  . Elevated PSA     4-7 in 2009  . Hypercholesteremia   . Urinary incontinence   . CKD (chronic kidney disease), stage III   . Hypertension     grade 1 diastolic dysfunction, trace MR and TR, mild LVH Echo 3-/4    PAST SURGICAL HISTORY: Past Surgical History  Procedure Laterality Date  . Gallbladder surgery  1996    FAMILY HISTORY: Family History  Problem Relation Age of Onset  . Cancer Daughter     BREAST  . Dementia Mother     SOCIAL HISTORY: Social History   Social History  . Marital Status: Married    Spouse Name: Myna Hidalgo  . Number of Children: 4  . Years of Education: College   Occupational History  . retired    Social History Main Topics  . Smoking status: Never Smoker   . Smokeless tobacco: Never Used  . Alcohol Use: No  . Drug Use: No  . Sexual Activity: Not on file   Other Topics Concern  . Not on file   Social History Narrative   Patient is married with 4 children.   Patient is right handed.   Patient has college education.   Caffeine Use: 3 cups daily      PHYSICAL EXAM  Filed Vitals:   12/15/14 1054  BP: 120/64  Pulse: 77  Height:  (1.702 m)  Weight: 182 lb 12.8 oz (82.918 kg)   Body mass index is 28.62 kg/(m^2).  Generalized: pleasant elderly [place] male, in no acute distress, pleasant elderly [place] male. Head: normocephalic and atraumatic.   Neck: Supple, no carotid bruits  Cardiac: Regular rate rhythm, no murmur  Musculoskeletal: small sebaceous cyst on the forehead in midline  Neurological examination  Mentation: Alert oriented to  , place. Diminished attention, registration and recall. Follows one-step commands, has difficulty with some two-step commands speech is clear but nonfluent. Palmomental reflexes present. Snout absent.  mini-mental status exam not done  Last visit 8/30   Cranial nerve II-XII:  Pupils were equal  round reactive to light extraocular movements were full, visual  field were full on confrontational test. Facial sensation and strength were normal. hearing was intact to finger rubbing bilaterally. Uvula tongue midline. head turning and shoulder shrug and were normal and symmetric.Tongue protrusion into cheek strength was normal. Motor: normal bulk and tone, full strength in the BUE, BLE, fine finger movements normal, no pronator drift. No focal weakness Sensory: normal and symmetric to light touch Coordination: finger-nose-finger, heel-to-shin bilaterally, no dysmetria Reflexes:  Deep tendon reflexes in the upper and lower extremities are present and symmetric.  Gait and Station: Steady but broad-based gait with leaning to left and unable to do tandem walking; no festination; minimal apraxia while turning.   ASSESSMENT AND PLAN 81 y.o. year old male  has a past medical history of Hyperlipemia; High cholesterol; Prostate disease; Macular degeneration; and Glaucoma here with progressive dementia times 3-4 years etiology now likely advanced Alzheimer's   PLAN: - I had a long discussion with the patient and his daughter with regards to his Alzheimer's which is now at an advanced stage. Continue Aricept 10 mg daily and Namenda 10 mg twice daily in the current dosages as he has not been able to tolerate higher doses in the past. Continue Seroquel 12.5 mg during daytime as needed and 25 mg at night for his agitation as it seems to be working well. I recommend he discontinue Lasix 20 mg at night as he is having problems with incontinence and continue it only during daytime. I have asked him to discuss this further with his cardiologist. Unfortunately there are no specific medications which will help the progression of Alzheimer's at this stage. The family continues to do a fantastic job of providing care for him at home Return for follow-up in 6 months or call earlier if necessary                                                Return in about 6 months (around 06/15/2015).  Delia Heady, MD Stroke Neurology 12/15/2014 11:42 AM

## 2014-12-15 NOTE — Patient Instructions (Signed)
I had a long discussion with the patient and his daughter with regards to his Alzheimer's which is now at an advanced stage. Continue Aricept 10 mg daily and Namenda 10 mg twice daily in the current dosages as he has not been able to tolerate higher doses in the past. Continue Seroquel 12.5 mg during daytime as needed and 25 mg at night for his agitation as it seems to be working well. I recommend he discontinue Lasix 20 mg at night as he is having problems with incontinence and continue it only during daytime. I have asked him to discuss this further with his cardiologist. Unfortunately there are no specific medications which will help the progression of Alzheimer's at this stage. The family continues to do a fantastic job of providing care for him at home Return for follow-up in 6 months or call earlier if necessary

## 2014-12-27 ENCOUNTER — Telehealth: Payer: Self-pay | Admitting: Neurology

## 2014-12-27 NOTE — Telephone Encounter (Signed)
Pt's daughter ,Dr. Salem Casterixit , called and states there is an increase in agitation . Would like a call back at [phone]

## 2014-12-28 ENCOUNTER — Other Ambulatory Visit: Payer: Self-pay | Admitting: Neurology

## 2014-12-28 MED ORDER — QUETIAPINE FUMARATE 25 MG PO TABS: 25.0000 mg | ORAL_TABLET | Freq: Two times a day (BID) | ORAL | Status: DC

## 2014-12-28 NOTE — Telephone Encounter (Signed)
I spoke to the patient's daughter Dr. Salem Casterixit who was concerned about patient's agitation episodes during the day not responding to seroquel 12.5 mg during the day and agree in increasing the seroquel dose to 25 mg twice daily. If this is not effective may consider  trying higher dose of Aricept or Namenda in the future. She voiced understanding.

## 2014-12-29 ENCOUNTER — Other Ambulatory Visit: Payer: Self-pay

## 2014-12-29 MED ORDER — QUETIAPINE FUMARATE 25 MG PO TABS: 25.0000 mg | ORAL_TABLET | Freq: Two times a day (BID) | ORAL | Status: DC

## 2015-01-03 ENCOUNTER — Ambulatory Visit (INDEPENDENT_AMBULATORY_CARE_PROVIDER_SITE_OTHER): Payer: Medicare Other | Admitting: Nurse Practitioner

## 2015-01-03 ENCOUNTER — Encounter: Payer: Self-pay | Admitting: Nurse Practitioner

## 2015-01-03 VITALS — BP 130/78 | HR 72 | Ht 66.0 in | Wt 184.4 lb

## 2015-01-03 DIAGNOSIS — I10 Essential (primary) hypertension: Secondary | ICD-10-CM

## 2015-01-03 DIAGNOSIS — G309 Alzheimer's disease, unspecified: Secondary | ICD-10-CM | POA: Diagnosis not present

## 2015-01-03 DIAGNOSIS — F0280 Dementia in other diseases classified elsewhere without behavioral disturbance: Secondary | ICD-10-CM | POA: Diagnosis not present

## 2015-01-03 MED ORDER — FUROSEMIDE 20 MG PO TABS: 20.0000 mg | ORAL_TABLET | Freq: Every day | ORAL | Status: DC | PRN

## 2015-01-03 NOTE — Patient Instructions (Addendum)
We will be checking the following labs today - NONE   Medication Instructions:    Continue with your current medicines. But   Change the Lasix to just as needed - this means for weight gain of over 3 pounds overnight.     Testing/Procedures To Be Arranged:  N/A  Follow-Up:   See Dr. Eldridge DaceVaranasi in February (6 month check)   Other Special Instructions:   Touch base with PCP    If you need a refill on your cardiac medications before your next appointment, please call your pharmacy.   Call the [facility] office at [phone] if you have any questions, problems or concerns.

## 2015-01-03 NOTE — Progress Notes (Signed)
CARDIOLOGY OFFICE NOTE  Date:  01/03/2015    David [name] Date of Birth: 10-Sep-1926 Medical Record #[id number]  PCP:  Georgann Housekeeper, MD  Cardiologist:  Eldridge Dace    Chief Complaint  Patient presents with  . Coronary Artery Disease    Follow up visit - seen for Dr. Eldridge Dace    History of Present Illness: David [name] is a 81 y.o. male who presents today for a work in visit. Here to discuss medicines.  Seen for Dr. Eldridge Dace.  He has a history of Alzheimer's dementia, diastolic dysfunction by echo from February 2014, CKD, HLD, HTN, macular degeneration, glaucoma and advanced age.  He had an abnormal stress test back in December of 2014.  Due to renal insufficiency, he has been managed medically. Daughter is a physician in [place]. Labs are done by PCP.   Seen back in August and felt to be stable. Family felt his angina was stable and he was continued on his current dose of Imdur.   Comes in today. Here with son. They are here due to urinary incontinence. Son gives the history primarily. He is wetting the bed at night.Despite wearing depends and being "wrapped" he continues to wet the bed. Family has been talking amongst themselves one who is nephrologist - suggested possible ? Adjust medicines. He has gained weight since he was last here. Much more sedentary. Does not seem to have much dyspnea. Some swelling but stable. Will have chest pain if walks too far according to the son but overall his angina seems to be stable. He is on Flomax.   Past Medical History  Diagnosis Date  . Hyperlipemia   . Alzheimer disease   . High cholesterol   . Prostate disease   . Macular degeneration   . Glaucoma   . Memory deficit   . Low back pain   . Colon polyp 2007  . Elevated PSA     4-7 in 2009  . Hypercholesteremia   . Urinary incontinence   . CKD (chronic kidney disease), stage III   . Hypertension     grade 1 diastolic dysfunction, trace MR and TR, mild LVH Echo 3-/4    Past  Surgical History  Procedure Laterality Date  . Gallbladder surgery  1996     Medications: Current Outpatient Prescriptions  Medication Sig Dispense Refill  . amLODipine (NORVASC) 2.5 MG tablet TK 1 T PO QD IN THE EVE  11  . ARICEPT 10 MG tablet Take 1 tablet (10 mg total) by mouth daily. 90 tablet 1  . aspirin 81 MG tablet Take 81 mg by mouth daily.    Marland Kitchen atorvastatin (LIPITOR) 10 MG tablet Take 10 mg by mouth daily.    . B Complex-C (SUPER B COMPLEX PO) Take 1 tablet by mouth daily.     . brimonidine (ALPHAGAN) 0.15 % ophthalmic solution 1 drop 3 (three) times daily.    . Cholecalciferol (VITAMIN D-3) 1000 UNITS CAPS Take 1,000 Units by mouth daily.    . fish oil-omega-3 fatty acids 1000 MG capsule Take 1 g by mouth daily.     . folic acid (FOLVITE) 1 MG tablet Take 1 mg by mouth daily.    . furosemide (LASIX) 20 MG tablet Take 1 tablet (20 mg total) by mouth daily. 180 tablet 2  . isosorbide mononitrate (IMDUR) 30 MG 24 hr tablet Take 1 tablet (30 mg total) by mouth daily. 90 tablet 2  . NAMENDA 10 MG tablet TAKE 1 TABLET BY  MOUTH TWICE DAILY 60 tablet 3  . NITROSTAT 0.6 MG SL tablet Place 0.6 mg under the tongue every 5 (five) minutes as needed for chest pain.     Marland Kitchen. omeprazole (PRILOSEC) 20 MG capsule Take 20 mg by mouth daily.    . QUEtiapine (SEROQUEL) 25 MG tablet Take 1 tablet (25 mg total) by mouth 2 (two) times daily. 180 tablet 1  . tamsulosin (FLOMAX) 0.4 MG CAPS capsule Take 0.8 mg by mouth every evening.      No current facility-administered medications for this visit.    Allergies: Allergies  Allergen Reactions  . Coreg [Carvedilol]     bradycardia  . Norvasc [Amlodipine Besylate]     swelling    Social History: The patient  reports that he has never smoked. He has never used smokeless tobacco. He reports that he does not drink alcohol or use illicit drugs.   Family History: The patient's family history includes Cancer in his daughter; Dementia in his mother.    Review of Systems: Please see the history of present illness.   Otherwise, the review of systems is positive for none.   All other systems are reviewed and negative.   Physical Exam: VS:  BP 130/78 mmHg  Pulse 72  Ht 5\' 6"  (1.676 m)  Wt 184 lb 6.4 oz (83.643 kg)  BMI 29.78 kg/m2  SpO2 97% .  BMI Body mass index is 29.78 kg/(m^2).  Wt Readings from Last 3 Encounters:  01/03/15 184 lb 6.4 oz (83.643 kg)  12/15/14 182 lb 12.8 oz (82.918 kg)  10/04/14 175 lb 12.8 oz (79.742 kg)    General: Pleasant. He is demented but in no acute distress. Really does not answer questions.  HEENT: Normal. Neck: Supple, no JVD, carotid bruits, or masses noted.  Cardiac: Regular rate and rhythm. Heart tones distant. Trace edema.  Respiratory:  Lungs are clear to auscultation bilaterally with normal work of breathing.  GI: Soft and nontender.  MS: No deformity or atrophy. Gait and ROM intact. Skin: Warm and dry. Color is normal.  Neuro:  Strength and sensation are intact and no gross focal deficits noted.  Psych: Alert, appropriate and with normal affect.   LABORATORY DATA:  EKG:  EKG is not ordered today.  Lab Results  Component Value Date   WBC 11.6* 04/10/2013   HGB 13.9 04/10/2013   HCT 42.0 04/10/2013   PLT 215 04/10/2013   GLUCOSE 81 11/25/2013   ALT 14 04/10/2013   AST 21 04/10/2013   NA 139 11/25/2013   K 3.6 11/25/2013   CL 102 11/25/2013   CREATININE 1.5 11/25/2013   BUN 24* 11/25/2013   CO2 33* 11/25/2013    BNP (last 3 results) No results for input(s): BNP in the last 8760 hours.  ProBNP (last 3 results) No results for input(s): PROBNP in the last 8760 hours.   Other Studies Reviewed Today:  Myoview Impression  Exercise Capacity: Lexiscan with no exercise.  BP Response: Normal blood pressure response.  Clinical Symptoms: There is dyspnea.  ECG Impression: No significant ST segment change suggestive of ischemia.  Comparison with Prior Nuclear Study: No  images to compare  Overall Impression: Intermediate risk stress nuclear study with moderate-large area of ischemia in the inferior wall and apex..  LV Ejection Fraction: 53%. LV Wall Motion: NL LV Function; NL Wall Motion  Corky CraftsVARANASI,JAYADEEP S., MD, [facility]     Assessment / Plan: 1. Abnormal Myoview - managed conservatively - he seems to be stable  with his current regimen.   2. HTN - BP ok on current regimen.   3. Progressive alzheimer's dementia   4.  Dyspnea - does not seem to be a real issue/complaint at this time.   5. Urinary incontinence - I suspect this is more a reflection of his progressive dementia and he most likely has BPH. He is on Flomax. Ok to change Lasix to just prn. He really has not had heart failure symptoms - normal EF. Family manages his diet. Would favor just using Lasix prn weight gain. I have suggested he see PCP or urology as well.    Current medicines are reviewed with the patient today.  The patient does not have concerns regarding medicines other than what has been noted above.  The following changes have been made:  See above.  Labs/ tests ordered today include:   No orders of the defined types were placed in this encounter.   Disposition:   FU with Dr. Eldridge Dace in February.    Patient is agreeable to this plan and will call if any problems develop in the interim.   Signed: Rosalio Macadamia, RN, ANP-C 01/03/2015 8:37 AM  [facility] [address] [place], [place]  [address] Phone: [phone] Fax: [phone]

## 2015-03-31 ENCOUNTER — Ambulatory Visit: Payer: Medicare Other | Admitting: Interventional Cardiology

## 2015-04-23 ENCOUNTER — Other Ambulatory Visit: Payer: Self-pay | Admitting: Neurology

## 2015-04-28 ENCOUNTER — Other Ambulatory Visit: Payer: Self-pay | Admitting: Neurology

## 2015-05-01 ENCOUNTER — Telehealth: Payer: Self-pay | Admitting: Neurology

## 2015-05-01 ENCOUNTER — Other Ambulatory Visit: Payer: Self-pay

## 2015-05-01 MED ORDER — ARICEPT 10 MG PO TABS: 10.0000 mg | ORAL_TABLET | Freq: Every day | ORAL | Status: DC

## 2015-05-01 NOTE — Telephone Encounter (Signed)
Pt's son called said NAMENDA 10 MG tablet needs PA.  He said [employer] medicare has sent letter.

## 2015-05-02 NOTE — Telephone Encounter (Signed)
Rn call patients pharmacy [facility] for his namenda. Pt has change to [employer]. We have old insurance card on file. [employer] medicard D number is [id number]. [facility]

## 2015-05-02 NOTE — Telephone Encounter (Signed)
[place] from [employer]  [place]val from 02/17/2015 to 02/18/2016. Referral number is [id number]

## 2015-05-03 ENCOUNTER — Ambulatory Visit (INDEPENDENT_AMBULATORY_CARE_PROVIDER_SITE_OTHER): Payer: Medicare Other | Admitting: Interventional Cardiology

## 2015-05-03 ENCOUNTER — Encounter: Payer: Self-pay | Admitting: Interventional Cardiology

## 2015-05-03 VITALS — BP 128/70 | HR 62 | Ht 66.0 in | Wt 181.0 lb

## 2015-05-03 DIAGNOSIS — G309 Alzheimer's disease, unspecified: Secondary | ICD-10-CM

## 2015-05-03 DIAGNOSIS — R9430 Abnormal result of cardiovascular function study, unspecified: Secondary | ICD-10-CM

## 2015-05-03 DIAGNOSIS — F0280 Dementia in other diseases classified elsewhere without behavioral disturbance: Secondary | ICD-10-CM

## 2015-05-03 DIAGNOSIS — N183 Chronic kidney disease, stage 3 unspecified: Secondary | ICD-10-CM

## 2015-05-03 DIAGNOSIS — I10 Essential (primary) hypertension: Secondary | ICD-10-CM

## 2015-05-03 DIAGNOSIS — N189 Chronic kidney disease, unspecified: Secondary | ICD-10-CM | POA: Insufficient documentation

## 2015-05-03 NOTE — Progress Notes (Signed)
Patient ID: David [name], male   DOB: January 09, 1927, 82 y.o.   MRN: [id number]     Cardiology Office Note   Date:  05/03/2015   ID:  David [name], DOB 1926-08-17, MRN [id number]  PCP:  Georgann Housekeeper, MD    No chief complaint on file. abnormal nuclear stress test   Wt Readings from Last 3 Encounters:  01/03/15 184 lb 6.4 oz (83.643 kg)  12/15/14 182 lb 12.8 oz (82.918 kg)  10/04/14 175 lb 12.8 oz (79.742 kg)       History of Present Illness: David [name] is a 82 y.o. male  With dementia who had an abnormal stress test several years ago and anginal symptoms. Due to renal insufficiency, he has been managed medically. He has multiple risk factors for heart disease.  He has not been walking much. His wife states that he is just getting more sedentary. His dementia is also progressing. He has not used any nitroglycerin under his tongue. He tries to stay out of the cold weather. He does not complain much.   Incontinence has been there most troubling issue. They tried to back off his Lasix but then the lower extremity swelling worsened to the point he couldn't get his shoes on. He is back to taking Lasix regularly. Swelling is under control.    Past Medical History  Diagnosis Date  . Hyperlipemia   . Alzheimer disease   . High cholesterol   . Prostate disease   . Macular degeneration   . Glaucoma   . Memory deficit   . Low back pain   . Colon polyp 2007  . Elevated PSA     4-7 in 2009  . Hypercholesteremia   . Urinary incontinence   . CKD (chronic kidney disease), stage III   . Hypertension     grade 1 diastolic dysfunction, trace MR and TR, mild LVH Echo 3-/4    Past Surgical History  Procedure Laterality Date  . Gallbladder surgery  1996     Current Outpatient Prescriptions  Medication Sig Dispense Refill  . amLODipine (NORVASC) 2.5 MG tablet TK 1 T PO QD IN THE EVE  11  . ARICEPT 10 MG tablet Take 1 tablet (10 mg total) by mouth daily. 90 tablet 1  .  aspirin 81 MG tablet Take 81 mg by mouth daily.    Marland Kitchen atorvastatin (LIPITOR) 10 MG tablet Take 10 mg by mouth daily.    . B Complex-C (SUPER B COMPLEX PO) Take 1 tablet by mouth daily.     . brimonidine (ALPHAGAN) 0.15 % ophthalmic solution 1 drop 3 (three) times daily.    . Cholecalciferol (VITAMIN D-3) 1000 UNITS CAPS Take 1,000 Units by mouth daily.    . fish oil-omega-3 fatty acids 1000 MG capsule Take 1 g by mouth daily.     . folic acid (FOLVITE) 1 MG tablet Take 1 mg by mouth daily.    . furosemide (LASIX) 20 MG tablet Take 1 tablet (20 mg total) by mouth daily as needed for fluid or edema (weight gain of 3 pounds overnight). 180 tablet 2  . isosorbide mononitrate (IMDUR) 30 MG 24 hr tablet Take 1 tablet (30 mg total) by mouth daily. 90 tablet 2  . NAMENDA 10 MG tablet TAKE 1 TABLET BY MOUTH TWICE DAILY 60 tablet 11  . NITROSTAT 0.6 MG SL tablet Place 0.6 mg under the tongue every 5 (five) minutes as needed for chest pain.     Marland Kitchen omeprazole (PRILOSEC) 20 MG  capsule Take 20 mg by mouth daily.    . QUEtiapine (SEROQUEL) 25 MG tablet Take 1 tablet (25 mg total) by mouth 2 (two) times daily. 180 tablet 1  . tamsulosin (FLOMAX) 0.4 MG CAPS capsule Take 0.8 mg by mouth every evening.      No current facility-administered medications for this visit.    Allergies:   Coreg and Norvasc    Social History:  The patient  reports that he has never smoked. He has never used smokeless tobacco. He reports that he does not drink alcohol or use illicit drugs.   Family History:  The patient's family history includes Cancer in his daughter; Dementia in his mother.    ROS:  Please see the history of present illness.   Otherwise, review of systems are positive for Lower extremity edema, progressive memory loss from Alzheimer's.   All other systems are reviewed and negative.    PHYSICAL EXAM: VS:  There were no vitals taken for this visit. , BMI There is no weight on file to calculate BMI. GEN: Well  nourished, well developed, in no acute distress HEENT: normal Neck: no JVD, carotid bruits, or masses Cardiac: RRR; no murmurs, rubs, or gallops, bilateral pitting lower extremity edema  Respiratory:  clear to auscultation bilaterally, normal work of breathing GI: soft, nontender, nondistended, + BS MS: no deformity or atrophy Skin: warm and dry, no rash Neuro:  Strength and sensation are intact Psych: euthymic mood, full affect   Recent Labs: No results found for requested labs within last 365 days.   Lipid Panel No results found for: CHOL, TRIG, HDL, CHOLHDL, VLDL, LDLCALC, LDLDIRECT   Other studies Reviewed: Additional studies/ records that were reviewed today with results demonstrating: Prior nuclear study results.   ASSESSMENT AND PLAN:  1. CAD/abnormal stress test: he is not limited by symptoms at this point , partially because he is not very active. He is more limited by his Alzheimer's disease. Would not pursue any invasive testing at this time.  The wife feels that his angina is controlled. The patient is not complaining of anything. I would recommend for him to try to walk at least 150 minutes a week, in a safe environment.  He is not a good candidate for invasive testing.  COuld increase Imdur if he had more sx. 2. hyperlipidemia : Managed by Dr. Eula ListenHussain. 3. hypertension: Blood pressure well controlled. 4. Chronic renal insufficiency: Managed by Dr. Eula ListenHussain. 5. Edema: Lasix was changed as needed due to incontinence. However, his leg swelling worsened. He is now taking the Lasix regularly and they're dealing with the incontinence problem. Edema still present but better than before.   Current medicines are reviewed at length with the patient today.  The patient concerns regarding his medicines were addressed.  The following changes have been made:  No change  Labs/ tests ordered today include:  No orders of the defined types were placed in this encounter.    Recommend  150 minutes/week of aerobic exercise Low fat, low carb, high fiber diet recommended  Disposition:   FU in one year   Delorise JacksonSigned, Kharter Sestak S., MD  05/03/2015 8:39 AM    [facility] [address], [place], [place]  [address] Phone: [phone]) [phone]; Fax: [phone]

## 2015-05-03 NOTE — Patient Instructions (Signed)
**Note De-identified  Obfuscation** Medication Instructions:  Same-no changes  Labwork: None  Testing/Procedures: None  Follow-Up: Your physician wants you to follow-up in: 1 year. You will receive a reminder letter in the mail two months in advance. If you don't receive a letter, please call our office to schedule the follow-up appointment.      If you need a refill on your cardiac medications before your next appointment, please call your pharmacy.   

## 2015-06-15 ENCOUNTER — Encounter: Payer: Self-pay | Admitting: Neurology

## 2015-06-15 ENCOUNTER — Ambulatory Visit (INDEPENDENT_AMBULATORY_CARE_PROVIDER_SITE_OTHER): Payer: Medicare Other | Admitting: Neurology

## 2015-06-15 VITALS — BP 115/60 | HR 80 | Ht 66.0 in | Wt 182.8 lb

## 2015-06-15 DIAGNOSIS — G301 Alzheimer's disease with late onset: Secondary | ICD-10-CM | POA: Diagnosis not present

## 2015-06-15 DIAGNOSIS — F0281 Dementia in other diseases classified elsewhere with behavioral disturbance: Secondary | ICD-10-CM

## 2015-06-15 DIAGNOSIS — F02818 Dementia in other diseases classified elsewhere, unspecified severity, with other behavioral disturbance: Secondary | ICD-10-CM

## 2015-06-15 NOTE — Patient Instructions (Signed)
I had a long discussion with the patient, wife and daughter regarding his progressive Alzheimer's dementia and lack of more definitive available treatment options at this advanced stage of the disease. Continue Aricept 10 mg daily and Namenda 10 mg twice daily as he has been unable to tolerate higher doses of these medications in the past. Continue Seroquel 12.5 mg twice daily for his behavioral agitation which appears to be adequately controlled at the present time. The family continues to provide fantastic care for him at home. He will return for follow-up in a year or call earlier if necessary.

## 2015-06-15 NOTE — Progress Notes (Signed)
PATIENT: David [name] DOB: 07/21/1926   REASON FOR VISIT: follow up for Alzheimer`s dementia HISTORY FROM: daughter and wife  HISTORY OF PRESENT ILLNESS: 82 year old patient with mild dementia x2 years etiology unclear-senile dementia versus early Alzheimer's. He appears clinically stable on objective testing the family feels is slightly worse.  01/15/12 (PS):  He returns for followup after last visit on 10/17/2010. Patient's son note they have noted progressive cognitive decline over the last one year. At last visit I had suggested increasing Aricept to 23 mg which he could not tolerate and reduced the dose back to 10 mg within a week. He is now having difficulty with completing sentences and word finding and at times forgets names of family members. He also has good and bad days. He at times is confused and disoriented. He has not had any significant difficulties with walking or falls. He remains on Aricept 10 mg daily as well as Namenda twice a day and fish oil. He keeps himself busy and mentally challenging tasks across her puzzles, [employer] and even [email or web address] but he has been needing more supervision. He is complaining of mild headache today and states he is having a bad day it did not do well on the Mini-Mental testing and scored only 13/30 with my medical assistant. After I administered the test prostrating the questions to him he is slightly better and scored 15/30 but clearly stated that he knew the answers but could not come up with him today.  UPDATE 03/24/13 (LL): Patient returns for a yearly followup visit his last visit was 01/15/2012.  He is accompanied by his daughter-in-law today.  His memory appears stable, MMSE score today is 18/30.  He has significant difficulty completing sentences and finding words that he wants to say.  He is in good mood and daughter-in-law states that he normally is. Patient has no complaints today.  He is tolerating Aricept 10 mg daily and Namenda 10 mg  twice a day well with no known side effects.  UPDATE 12/30/2013 (PS) :he returns for follow-up accompanied by his son. He seems to be doing about the same. He had a recent trip to [place] with his wife for a month to spend time with his daughters. Continues to have memory and cognitive difficulties which are essentially unchanged. He has occasional time finding words and completing sentences. Is usually in a good mood. He is tolerating Aricept 10 mg daily but for some reason is on Namenda 10 mg once a day only. He is having no GI or CNS side effects. He has had trouble tolerating the higher dose of Aricept and Namenda XR in the past.18/30 which is unchanged from last visit  Update 04/20/2014 : He returns for follow-up today accompanied by his wife and son-in-law after last visit 3 months ago. The family is concerned about increased agitation, irritability, confusion and disorientation. He was previously going to [facility] for 3-4 hours twice a week but recently he has been reluctant to do so and wants to go home after only a couple of hours. The wife states that she is at times finding it difficult to redirect him and tell him. He tends to reminiscent about his past and at times talks about his parents who were there been long dead. There are no concerns about violence, fall risk, gait or balance problems. He remains on Aricept 10 mg daily and Namenda 10 mg twice daily. He has not been able to tolerate higher doses of both medications  in the past. Patient has previously not been diagnosed with depression but on today's testing he scored 8 on the geriatric depression scale. His Mini-Mental status score today is 17/30 which is stable from last visit. He has had no other new health problems since her last visit.  Update 08/31/14: Patient returns for follow-up today accompanied by his daughter. The family continued have concerns about increased agitation, irritability, confusion and disorientation.  He was prescribed with depakote at last visit. However, according to Dr. Zannie Cove note, pt never took it as "it is too strong". He was prescribed with seroquel and family gives him 12.5mg  every night. He seems responding to medication well and the agitation issues seems to improved. He normally has sun downing in the evenings, and seroquel helps. However, recently, he has 2-3 episodes of agitation, confusion in the afternoon. For example, he closes all the curtains, shut the door in the afternoon, saying he has to go attend meeting and people is waiting for him, which is unusual for him that early during the day. Family concerned about the progression and asked earlier follow up in clinic. Had MMSE testing in clinic which is 8/30. However, in clinic today, pt is calm and polite, and cooperative.  Update 12/15/2014 : He returns for follow-up today after last visit with Dr. Roda Shutters 3 months ago. He was having issues with agitation during the day at that visit and that seems to have improved after low dose Seroquel 12.5 mg was used as needed during the daytime. He continues to take 25 mg at night which seems to help. His been having some intermittent issues with incontinence particularly at night. He has been taking Lasix 20 mg twice daily for leg swelling. He does go to [facility] twice a week but usually gets tired than a few hours and call us to be brought home. He does have a caregiver who comes for a few hours. Patient's family continues to take excellent care of him at home. He has not been able to tolerate higher doses of Aricept and Namenda in the past but remains on Aricept 10 mg daily and Namenda 10 g twice daily which is tolerating well. He has had no major falls but does need little help and direction with getting up and walking. There have been no delusions or hallucinations and wandering or unsafe behavior. She does continue to get confused and have poor short-term memory and attention. The family feels gradually they  have seen a decline over the last 1 year. Update 06/15/2015 : He returns for follow-up after last visit 6 months ago. Is accompanied by his wife and daughter. The patient continues to require 24-hour care. His behavior seems reasonably well controlled on the current dose of Seroquel 12.5 twice daily but he still is sleepy during the day. He is able to sleep most nights quite well. He continues to have bladder issues particularly in the evening when he cannot reach the restroom or call. He does wear a diaper but during the daytime he can use a urinal. There have been no issues with agitation, unsafe behavior, delusions, hallucinations. He remains on Aricept 10 mg daily and Namenda 10 mg twice daily and is tolerating both medications well. He did spend a few months in [place] with his daughter and the trip went well without major incident. The family continues to do a great job in and provide 24-hour supervision and care at home. They did discuss with his cardiologist whether to stop Lasix but decided  to continue it due to leg swelling REVIEW OF SYSTEMS: Full 14 system review of systems performed and notable only for:  Shortness of breath, leg swelling, cold intolerance, incontinence of bladder, easy bruising, memory loss, confusion, daytime sleepiness, leg swelling and all other systems negative ALLERGIES: Allergies  Allergen Reactions  . Coreg [Carvedilol]     bradycardia  . Norvasc [Amlodipine Besylate]     swelling    HOME MEDICATIONS: Outpatient Prescriptions Prior to Visit  Medication Sig Dispense Refill  . amLODipine (NORVASC) 2.5 MG tablet TK 1 T PO QD IN THE EVE  11  . ARICEPT 10 MG tablet Take 1 tablet (10 mg total) by mouth daily. 90 tablet 1  . aspirin 81 MG tablet Take 81 mg by mouth daily.    Marland Kitchen atorvastatin (LIPITOR) 10 MG tablet Take 10 mg by mouth daily.    . B Complex-C (SUPER B COMPLEX PO) Take 1 tablet by mouth daily.     . brimonidine (ALPHAGAN) 0.15 % ophthalmic solution 1  drop 3 (three) times daily.    . Cholecalciferol (VITAMIN D-3) 1000 UNITS CAPS Take 1,000 Units by mouth daily.    . fish oil-omega-3 fatty acids 1000 MG capsule Take 1 g by mouth daily.     . folic acid (FOLVITE) 1 MG tablet Take 1 mg by mouth daily.    . furosemide (LASIX) 20 MG tablet Take 1 tablet (20 mg total) by mouth daily as needed for fluid or edema (weight gain of 3 pounds overnight). 180 tablet 2  . isosorbide mononitrate (IMDUR) 30 MG 24 hr tablet Take 1 tablet (30 mg total) by mouth daily. 90 tablet 2  . NAMENDA 10 MG tablet TAKE 1 TABLET BY MOUTH TWICE DAILY 60 tablet 11  . NITROSTAT 0.6 MG SL tablet Place 0.6 mg under the tongue every 5 (five) minutes as needed for chest pain.     Marland Kitchen omeprazole (PRILOSEC) 20 MG capsule Take 20 mg by mouth daily.    . QUEtiapine (SEROQUEL) 25 MG tablet Take 12.5 mg by mouth 2 (two) times daily.    . tamsulosin (FLOMAX) 0.4 MG CAPS capsule Take 0.8 mg by mouth every evening.      No facility-administered medications prior to visit.    PAST MEDICAL HISTORY: Past Medical History  Diagnosis Date  . Hyperlipemia   . Alzheimer disease   . High cholesterol   . Prostate disease   . Macular degeneration   . Glaucoma   . Memory deficit   . Low back pain   . Colon polyp 2007  . Elevated PSA     4-7 in 2009  . Hypercholesteremia   . Urinary incontinence   . CKD (chronic kidney disease), stage III   . Hypertension     grade 1 diastolic dysfunction, trace MR and TR, mild LVH Echo 3-/4    PAST SURGICAL HISTORY: Past Surgical History  Procedure Laterality Date  . Gallbladder surgery  1996    FAMILY HISTORY: Family History  Problem Relation Age of Onset  . Cancer Daughter     BREAST  . Dementia Mother   . Heart attack Neg Hx   . Hypertension Neg Hx   . Stroke Sister     X2    SOCIAL HISTORY: Social History   Social History  . Marital Status: Married    Spouse Name: Myna Hidalgo  . Number of Children: 4  . Years of Education: College     Occupational History  .  retired    Social History Main Topics  . Smoking status: Never Smoker   . Smokeless tobacco: Never Used  . Alcohol Use: No  . Drug Use: No  . Sexual Activity: Not on file   Other Topics Concern  . Not on file   Social History Narrative   Patient is married with 4 children.   Patient is right handed.   Patient has college education.   Caffeine Use: 3 cups daily      PHYSICAL EXAM  Filed Vitals:   06/15/15 1129  BP: 115/60  Pulse: 80  Height: 5\' 6"  (1.676 m)  Weight: 182 lb 12.8 oz (82.918 kg)   Body mass index is 29.52 kg/(m^2).  Generalized: pleasant elderly [place] male, in no acute distress, pleasant elderly [place] male. Head: normocephalic and atraumatic.   Neck: Supple, no carotid bruits  Cardiac: Regular rate rhythm, no murmur  Musculoskeletal: small sebaceous cyst on the forehead in midline  Neurological examination  Mentation: Alert oriented to  Person and  place. Diminished attention, registration and recall. Follows one-step commands, has difficulty with some two-step commands speech is clear but nonfluent. Palmomental reflexes present. Snout absent.  mini-mental status exam not done    Cranial nerve II-XII:  Pupils were equal round reactive to light extraocular movements were full, visual field were full on confrontational test. Facial sensation and strength were normal. hearing was intact to finger rubbing bilaterally. Uvula tongue midline. head turning and shoulder shrug and were normal and symmetric.Tongue protrusion into cheek strength was normal. Motor: normal bulk and tone, full strength in the BUE, BLE, fine finger movements normal, no pronator drift. No focal weakness Sensory: normal and symmetric to light touch Coordination: finger-nose-finger, heel-to-shin bilaterally, no dysmetria Reflexes:  Deep tendon reflexes in the upper and lower extremities are present and symmetric.  Gait and Station: Steady  but broad-based gait with leaning to left and unable to do tandem walking; no festination; minimal apraxia while turning.   ASSESSMENT AND PLAN 82 y.o. year old male  has a past medical history of Hyperlipemia; High cholesterol; Prostate disease; Macular degeneration; and Glaucoma here with progressive dementia times 3-4 years etiology now likely advanced Alzheimer's   PLAN: - I had a long discussion with the patient and his daughter with regards to his Alzheimer's which is now at an advanced stage. Continue Aricept 10 mg daily and Namenda 10 mg twice daily in the current dosages as he has not been able to tolerate higher doses in the past. Continue Seroquel 12.5 mg twice daily for his agitation as it seems to be working well but he still has some excessive daytime sleepiness and hence will not increase dose further..   Unfortunately there are no specific medications which will help the progression of Alzheimer's at this stage. The family continues to do a fantastic job of providing care for him at home Return for follow-up in one year or call earlier if necessary. Greater than 50% time during this 25 minute visit was spent on counseling and coordination of care about his advance Alzheimer's                                               Return in about 1 year (around 06/14/2016).  Delia HeadyPramod Bertrice Leder, MD Stroke Neurology 06/15/2015 6:17 PM

## 2015-08-14 ENCOUNTER — Other Ambulatory Visit: Payer: Self-pay | Admitting: Interventional Cardiology

## 2015-09-14 ENCOUNTER — Ambulatory Visit (INDEPENDENT_AMBULATORY_CARE_PROVIDER_SITE_OTHER): Payer: Medicare Other | Admitting: Podiatry

## 2015-09-14 ENCOUNTER — Encounter: Payer: Self-pay | Admitting: Podiatry

## 2015-09-14 DIAGNOSIS — B351 Tinea unguium: Secondary | ICD-10-CM | POA: Diagnosis not present

## 2015-09-14 DIAGNOSIS — B353 Tinea pedis: Secondary | ICD-10-CM

## 2015-09-14 NOTE — Patient Instructions (Signed)
Seen for red spots on both feet.  Possible fungal infection from hot sweaty shoes. Use open toed sandals and anti fungal cream daily.  All nails debrided. Return as needed.

## 2015-09-14 NOTE — Progress Notes (Signed)
Subjective: 82 year old male presents with wife complaining of red spots on both feet. Also request for toe nails trimmed.   Objective: Vascular: Pedal pulses are palpable on both feet. Positive of mild pedal edema on left foot. Dermatologic: Spotted red blotch skin without open skin or draining lesion on medial and lateral aspect of both feet. Thick dystrophic nails both great toes and 2nd toe left. Orthopedic: No gross deformities. Neurologic: All epicritic and tactile sensations are grossly intact.  Assessment: Onychomycosis x both great toes, and 2nd left. Tinea pedis, dry red spots on both feet at medial and lateral aspect of dorsum.   Plan: All problematic nails debrided. Advised to use Lamisil cream and open toed sandals. Return as needed.

## 2015-10-20 ENCOUNTER — Other Ambulatory Visit: Payer: Self-pay | Admitting: Neurology

## 2015-11-10 ENCOUNTER — Other Ambulatory Visit: Payer: Self-pay | Admitting: Interventional Cardiology

## 2015-11-10 NOTE — Telephone Encounter (Signed)
Patients snapshot has furosemide listed twice and with two different sigs. Per office visit with Norma FredricksonLori Gerhardt 01/03/15:  Medication Instructions:    Continue with your current medicines. But  Change the Lasix to just as needed - this means for weight gain of over 3 pounds overnight   The patient saw Dr Eldridge DaceVaranasi on 05/03/15 and per that office visit: They tried to back off his Lasix but then the lower extremity swelling worsened to the point he couldn't get his shoes on. He is back to taking Lasix regularly. Swelling is under control. 1. Edema: Lasix was changed as needed due to incontinence. However, his leg swelling worsened. He is now taking the Lasix regularly and they're dealing with the incontinence problem. Edema still present but better than before.   Please advise.  Thanks, MI

## 2015-11-10 NOTE — Telephone Encounter (Signed)
**Note De-Identified  Obfuscation** According to the pts chart he was taking Furosemide 20 mg on 01/04/16. Ok to refill at 20 mg daily.

## 2016-01-09 ENCOUNTER — Telehealth: Payer: Self-pay | Admitting: Neurology

## 2016-01-09 NOTE — Telephone Encounter (Signed)
Error

## 2016-01-09 NOTE — Telephone Encounter (Signed)
I spoke to the patient's daughter who informed me that for the last couple of weeks the patient seems to have gotten cognitively worse and has trouble getting out of chair and following simple commands. She was wondering if this is progression of his dementia He did see his primary care physician a month ago and had some basic lab work which was normal. Patient is presently on Aricept 10 mg and Namenda 10 mg twice daily. He had trouble tolerating higher dose of Namenda in the past. I recommend trying Namzaric ( aricept 10 mg and extended release namenda 28 mg) to see if he can tolerate this and stopping Aricept and Namenda which is taking them in separate pills. She will discuss this amongst her family members and get back to us. Meanwhile if we have a cancellation tomorrow will try to work him in. She voiced understanding.

## 2016-01-09 NOTE — Telephone Encounter (Addendum)
Daughter David [name] called to request reschedule of January appointment to tomorrow, states she called earlier and was offered  8:30, 9:30 but couldn't do due to her work schedule, states there was 1:00 available, would like to get this appointment time if still available. Advised appointment for tomorrow not available (saved for work in's per [place]), offered to put on cancellation list, daughter asks if she can call tomorrow for cancellation tomorrow, advised I will send message to nurse, daughter states she and family have noticed recent changes; it's hard for patient to get out of bed, has hard time lifting himself up, movements are a lot slower than they used to be, a lot of ankle swelling.

## 2016-01-19 ENCOUNTER — Other Ambulatory Visit: Payer: Self-pay | Admitting: *Deleted

## 2016-01-19 ENCOUNTER — Telehealth: Payer: Self-pay | Admitting: Interventional Cardiology

## 2016-01-19 MED ORDER — FUROSEMIDE 20 MG PO TABS: 20.0000 mg | ORAL_TABLET | Freq: Two times a day (BID) | ORAL | 11 refills | Status: DC

## 2016-01-19 NOTE — Telephone Encounter (Signed)
Left message to call back  

## 2016-01-19 NOTE — Telephone Encounter (Signed)
I spoke with pt's son.  He reports prescription for furosemide has recently been changed to [facility]. New prescription is for 20 mg daily but son states pt has been taking 20 mg twice daily since at least 2015.  Chart reviewed and medication list indicates pt has been taking 20 mg daily and extra 20 mg daily as needed. Had been adjusted due to incontinence issues.   Son feels pt needs 20 mg twice daily and requests prescription be sent for this dose. He requests 30 day supply be sent to [facility] [place]. Pt has not had recent lab work in our system but son states lab work was recently done by primary care.  Will send to Dr. Eldridge DaceVaranasi to see if Lasix can be filled for 20 mg twice daily.

## 2016-01-19 NOTE — Telephone Encounter (Signed)
New Message:   Please call,question about pt's medicine.

## 2016-01-19 NOTE — Telephone Encounter (Signed)
Message left that prescription has been sent to pharmacy. Left message that office is currently closed and to return call on Monday if questions. Prescription for Lasix 20 mg twice daily sent to [facility]

## 2016-01-19 NOTE — Telephone Encounter (Signed)
Lasix 20 mg PO BID ok to refill.

## 2016-01-23 ENCOUNTER — Ambulatory Visit (INDEPENDENT_AMBULATORY_CARE_PROVIDER_SITE_OTHER): Payer: Medicare Other | Admitting: Rheumatology

## 2016-01-23 ENCOUNTER — Ambulatory Visit (INDEPENDENT_AMBULATORY_CARE_PROVIDER_SITE_OTHER): Payer: Medicare Other | Admitting: Orthopaedic Surgery

## 2016-01-23 ENCOUNTER — Encounter: Payer: Self-pay | Admitting: Rheumatology

## 2016-01-23 VITALS — BP 133/75 | HR 70 | Resp 14 | Ht 66.0 in | Wt 178.0 lb

## 2016-01-23 DIAGNOSIS — G308 Other Alzheimer's disease: Secondary | ICD-10-CM | POA: Diagnosis not present

## 2016-01-23 DIAGNOSIS — N183 Chronic kidney disease, stage 3 unspecified: Secondary | ICD-10-CM

## 2016-01-23 DIAGNOSIS — M25512 Pain in left shoulder: Secondary | ICD-10-CM | POA: Diagnosis not present

## 2016-01-23 DIAGNOSIS — G8929 Other chronic pain: Secondary | ICD-10-CM | POA: Diagnosis not present

## 2016-01-23 DIAGNOSIS — I10 Essential (primary) hypertension: Secondary | ICD-10-CM | POA: Diagnosis not present

## 2016-01-23 DIAGNOSIS — G309 Alzheimer's disease, unspecified: Secondary | ICD-10-CM

## 2016-01-23 DIAGNOSIS — M25561 Pain in right knee: Secondary | ICD-10-CM | POA: Diagnosis not present

## 2016-01-23 DIAGNOSIS — F0281 Dementia in other diseases classified elsewhere with behavioral disturbance: Secondary | ICD-10-CM | POA: Diagnosis not present

## 2016-01-23 DIAGNOSIS — M25562 Pain in left knee: Secondary | ICD-10-CM

## 2016-01-23 MED ORDER — TRIAMCINOLONE ACETONIDE 40 MG/ML IJ SUSP
40.0000 mg | INTRAMUSCULAR | Status: AC | PRN
  Administered 2016-01-23: 40 mg via INTRA_ARTICULAR

## 2016-01-23 MED ORDER — LIDOCAINE HCL 1 % IJ SOLN
1.5000 mL | INTRAMUSCULAR | Status: AC | PRN
  Administered 2016-01-23: 1.5 mL

## 2016-01-23 NOTE — Patient Instructions (Signed)
Knee Exercises Ask your health care provider which exercises are safe for you. Do exercises exactly as told by your health care provider and adjust them as directed. It is normal to feel mild stretching, pulling, tightness, or discomfort as you do these exercises, but you should stop right away if you feel sudden pain or your pain gets worse.Do not begin these exercises until told by your health care provider. STRETCHING AND RANGE OF MOTION EXERCISES  These exercises warm up your muscles and joints and improve the movement and flexibility of your knee. These exercises also help to relieve pain, numbness, and tingling. Exercise A: Knee Extension, Prone 1. Lie on your abdomen on a bed. 2. Place your left / right knee just beyond the edge of the surface so your knee is not on the bed. You can put a towel under your left / right thigh just above your knee for comfort. 3. Relax your leg muscles and allow gravity to straighten your knee. You should feel a stretch behind your left / right knee. 4. Hold this position for __________ seconds. 5. Scoot up so your knee is supported between repetitions. Repeat __________ times. Complete this stretch __________ times a day. Exercise B: Knee Flexion, Active  1. Lie on your back with both knees straight. If this causes back discomfort, bend your left / right knee so your foot is flat on the floor. 2. Slowly slide your left / right heel back toward your buttocks until you feel a gentle stretch in the front of your knee or thigh. 3. Hold this position for __________ seconds. 4. Slowly slide your left / right heel back to the starting position. Repeat __________ times. Complete this exercise __________ times a day. Exercise C: Quadriceps, Prone  1. Lie on your abdomen on a firm surface, such as a bed or padded floor. 2. Bend your left / right knee and hold your ankle. If you cannot reach your ankle or pant leg, loop a belt around your foot and grab the belt  instead. 3. Gently pull your heel toward your buttocks. Your knee should not slide out to the side. You should feel a stretch in the front of your thigh and knee. 4. Hold this position for __________ seconds. Repeat __________ times. Complete this stretch __________ times a day. Exercise D: Hamstring, Supine 1. Lie on your back. 2. Loop a belt or towel over the ball of your left / right foot. The ball of your foot is on the walking surface, right under your toes. 3. Straighten your left / right knee and slowly pull on the belt to raise your leg until you feel a gentle stretch behind your knee.  Do not let your left / right knee bend while you do this.  Keep your other leg flat on the floor. 4. Hold this position for __________ seconds. Repeat __________ times. Complete this stretch __________ times a day. STRENGTHENING EXERCISES  These exercises build strength and endurance in your knee. Endurance is the ability to use your muscles for a long time, even after they get tired. Exercise E: Quadriceps, Isometric  1. Lie on your back with your left / right leg extended and your other knee bent. Put a rolled towel or small pillow under your knee if told by your health care provider. 2. Slowly tense the muscles in the front of your left / right thigh. You should see your kneecap slide up toward your hip or see increased dimpling just above the   knee. This motion will push the back of the knee toward the floor. 3. For __________ seconds, keep the muscle as tight as you can without increasing your pain. 4. Relax the muscles slowly and completely. Repeat __________ times. Complete this exercise __________ times a day. Exercise F: Straight Leg Raises - Quadriceps 1. Lie on your back with your left / right leg extended and your other knee bent. 2. Tense the muscles in the front of your left / right thigh. You should see your kneecap slide up or see increased dimpling just above the knee. Your thigh may  even shake a bit. 3. Keep these muscles tight as you raise your leg 4-6 inches (10-15 cm) off the floor. Do not let your knee bend. 4. Hold this position for __________ seconds. 5. Keep these muscles tense as you lower your leg. 6. Relax your muscles slowly and completely after each repetition. Repeat __________ times. Complete this exercise __________ times a day. Exercise G: Hamstring, Isometric 1. Lie on your back on a firm surface. 2. Bend your left / right knee approximately __________ degrees. 3. Dig your left / right heel into the surface as if you are trying to pull it toward your buttocks. Tighten the muscles in the back of your thighs to dig as hard as you can without increasing any pain. 4. Hold this position for __________ seconds. 5. Release the tension gradually and allow your muscles to relax completely for __________ seconds after each repetition. Repeat __________ times. Complete this exercise __________ times a day. Exercise H: Hamstring Curls  If told by your health care provider, do this exercise while wearing ankle weights. Begin with __________ weights. Then increase the weight by 1 lb (0.5 kg) increments. Do not wear ankle weights that are more than __________. 1. Lie on your abdomen with your legs straight. 2. Bend your left / right knee as far as you can without feeling pain. Keep your hips flat against the floor. 3. Hold this position for __________ seconds. 4. Slowly lower your leg to the starting position. Repeat __________ times. Complete this exercise __________ times a day. Exercise I: Squats (Quadriceps) 1. Stand in front of a table, with your feet and knees pointing straight ahead. You may rest your hands on the table for balance but not for support. 2. Slowly bend your knees and lower your hips like you are going to sit in a chair.  Keep your weight over your heels, not over your toes.  Keep your lower legs upright so they are parallel with the table  legs.  Do not let your hips go lower than your knees.  Do not bend lower than told by your health care provider.  If your knee pain increases, do not bend as low. 3. Hold the squat position for __________ seconds. 4. Slowly push with your legs to return to standing. Do not use your hands to pull yourself to standing. Repeat __________ times. Complete this exercise __________ times a day. Exercise J: Wall Slides (Quadriceps)  1. Lean your back against a smooth wall or door while you walk your feet out 18-24 inches (46-61 cm) from it. 2. Place your feet hip-width apart. 3. Slowly slide down the wall or door until your knees bend __________ degrees. Keep your knees over your heels, not over your toes. Keep your knees in line with your hips. 4. Hold for __________ seconds. Repeat __________ times. Complete this exercise __________ times a day. Exercise K: Straight Leg Raises -   Hip Abductors 1. Lie on your side with your left / right leg in the top position. Lie so your head, shoulder, knee, and hip line up. You may bend your bottom knee to help you keep your balance. 2. Roll your hips slightly forward so your hips are stacked directly over each other and your left / right knee is facing forward. 3. Leading with your heel, lift your top leg 4-6 inches (10-15 cm). You should feel the muscles in your outer hip lifting.  Do not let your foot drift forward.  Do not let your knee roll toward the ceiling. 4. Hold this position for __________ seconds. 5. Slowly return your leg to the starting position. 6. Let your muscles relax completely after each repetition. Repeat __________ times. Complete this exercise __________ times a day. Exercise L: Straight Leg Raises - Hip Extensors 1. Lie on your abdomen on a firm surface. You can put a pillow under your hips if that is more comfortable. 2. Tense the muscles in your buttocks and lift your left / right leg about 4-6 inches (10-15 cm). Keep your knee  straight as you lift your leg. 3. Hold this position for __________ seconds. 4. Slowly lower your leg to the starting position. 5. Let your leg relax completely after each repetition. Repeat __________ times. Complete this exercise __________ times a day. This information is not intended to replace advice given to you by your health care provider. Make sure you discuss any questions you have with your health care provider. Document Released: 12/19/2004 Document Revised: 10/30/2015 Document Reviewed: 12/11/2014 [employer] Interactive Patient Education  2017 [employer] Shoulder Exercises Ask your health care provider which exercises are safe for you. Do exercises exactly as told by your health care provider and adjust them as directed. It is normal to feel mild stretching, pulling, tightness, or discomfort as you do these exercises, but you should stop right away if you feel sudden pain or your pain gets worse.Do not begin these exercises until told by your health care provider. RANGE OF MOTION EXERCISES  These exercises warm up your muscles and joints and improve the movement and flexibility of your shoulder. These exercises also help to relieve pain, numbness, and tingling. These exercises involve stretching your injured shoulder directly. Exercise A: Pendulum  1. Stand near a wall or a surface that you can hold onto for balance. 2. Bend at the waist and let your left / right arm hang straight down. Use your other arm to support you. Keep your back straight and do not lock your knees. 3. Relax your left / right arm and shoulder muscles, and move your hips and your trunk so your left / right arm swings freely. Your arm should swing because of the motion of your body, not because you are using your arm or shoulder muscles. 4. Keep moving your body so your arm swings in the following directions, as told by your health care provider:  Side to side.  Forward and backward.  In clockwise and  counterclockwise circles. 5. Continue each motion for __________ seconds, or for as long as told by your health care provider. 6. Slowly return to the starting position. Repeat __________ times. Complete this exercise __________ times a day. Exercise B:Flexion, Standing  1. Stand and hold a broomstick, a cane, or a similar object. Place your hands a little more than shoulder-width apart on the object. Your left / right hand should be palm-up, and your other hand should be palm-down.   2. Keep your elbow straight and keep your shoulder muscles relaxed. Push the stick down with your healthy arm to raise your left / right arm in front of your body, and then over your head until you feel a stretch in your shoulder.  Avoid shrugging your shoulder while you raise your arm. Keep your shoulder blade tucked down toward the middle of your back. 3. Hold for __________ seconds. 4. Slowly return to the starting position. Repeat __________ times. Complete this exercise __________ times a day. Exercise C: Abduction, Standing 1. Stand and hold a broomstick, a cane, or a similar object. Place your hands a little more than shoulder-width apart on the object. Your left / right hand should be palm-up, and your other hand should be palm-down. 2. While keeping your elbow straight and your shoulder muscles relaxed, push the stick across your body toward your left / right side. Raise your left / right arm to the side of your body and then over your head until you feel a stretch in your shoulder.  Do not raise your arm above shoulder height, unless your health care provider tells you to do that.  Avoid shrugging your shoulder while you raise your arm. Keep your shoulder blade tucked down toward the middle of your back. 3. Hold for __________ seconds. 4. Slowly return to the starting position. Repeat __________ times. Complete this exercise __________ times a day. Exercise D:Internal Rotation  5. Place your left /  right hand behind your back, palm-up. 6. Use your other hand to dangle an exercise band, a towel, or a similar object over your shoulder. Grasp the band with your left / right hand so you are holding onto both ends. 7. Gently pull up on the band until you feel a stretch in the front of your left / right shoulder.  Avoid shrugging your shoulder while you raise your arm. Keep your shoulder blade tucked down toward the middle of your back. 8. Hold for __________ seconds. 9. Release the stretch by letting go of the band and lowering your hands. Repeat __________ times. Complete this exercise __________ times a day. STRETCHING EXERCISES  These exercises warm up your muscles and joints and improve the movement and flexibility of your shoulder. These exercises also help to relieve pain, numbness, and tingling. These exercises are done using your healthy shoulder to help stretch the muscles of your injured shoulder. Exercise E: Corner Stretch (External Rotation and Abduction)  1. Stand in a doorway with one of your feet slightly in front of the other. This is called a staggered stance. If you cannot reach your forearms to the door frame, stand facing a corner of a room. 2. Choose one of the following positions as told by your health care provider:  Place your hands and forearms on the door frame above your head.  Place your hands and forearms on the door frame at the height of your head.  Place your hands on the door frame at the height of your elbows. 3. Slowly move your weight onto your front foot until you feel a stretch across your chest and in the front of your shoulders. Keep your head and chest upright and keep your abdominal muscles tight. 4. Hold for __________ seconds. 5. To release the stretch, shift your weight to your back foot. Repeat __________ times. Complete this stretch __________ times a day. Exercise F:Extension, Standing 1. Stand and hold a broomstick, a cane, or a similar  object behind your back.    Your hands should be a little wider than shoulder-width apart.  Your palms should face away from your back. 2. Keeping your elbows straight and keeping your shoulder muscles relaxed, move the stick away from your body until you feel a stretch in your shoulder.  Avoid shrugging your shoulders while you move the stick. Keep your shoulder blade tucked down toward the middle of your back. 3. Hold for __________ seconds. 4. Slowly return to the starting position. Repeat __________ times. Complete this exercise __________ times a day. STRENGTHENING EXERCISES  These exercises build strength and endurance in your shoulder. Endurance is the ability to use your muscles for a long time, even after they get tired. Exercise G:External Rotation  6. Sit in a stable chair without armrests. 7. Secure an exercise band at elbow height on your left / right side. 8. Place a soft object, such as a folded towel or a small pillow, between your left / right upper arm and your body to move your elbow a few inches away (about 10 cm) from your side. 9. Hold the end of the band so it is tight and there is no slack. 10. Keeping your elbow pressed against the soft object, move your left / right forearm out, away from your abdomen. Keep your body steady so only your forearm moves. 11. Hold for __________ seconds. 12. Slowly return to the starting position. Repeat __________ times. Complete this exercise __________ times a day. Exercise H:Shoulder Abduction  1. Sit in a stable chair without armrests, or stand. 2. Hold a __________ weight in your left / right hand, or hold an exercise band with both hands. 3. Start with your arms straight down and your left / right palm facing in, toward your body. 4. Slowly lift your left / right hand out to your side. Do not lift your hand above shoulder height unless your health care provider tells you that this is safe.  Keep your arms straight.  Avoid  shrugging your shoulder while you do this movement. Keep your shoulder blade tucked down toward the middle of your back. 5. Hold for __________ seconds. 6. Slowly lower your arm, and return to the starting position. Repeat __________ times. Complete this exercise __________ times a day. Exercise I:Shoulder Extension 1. Sit in a stable chair without armrests, or stand. 2. Secure an exercise band to a stable object in front of you where it is at shoulder height. 3. Hold one end of the exercise band in each hand. Your palms should face each other. 4. Straighten your elbows and lift your hands up to shoulder height. 5. Step back, away from the secured end of the exercise band, until the band is tight and there is no slack. 6. Squeeze your shoulder blades together as you pull your hands down to the sides of your thighs. Stop when your hands are straight down by your sides. Do not let your hands go behind your body. 7. Hold for __________ seconds. 8. Slowly return to the starting position. Repeat __________ times. Complete this exercise __________ times a day. Exercise J:Standing Shoulder Row 5. Sit in a stable chair without armrests, or stand. 6. Secure an exercise band to a stable object in front of you so it is at waist height. 7. Hold one end of the exercise band in each hand. Your palms should be in a thumbs-up position. 8. Bend each of your elbows to an "L" shape (about 90 degrees) and keep your upper arms at your sides.   9. Step back until the band is tight and there is no slack. 10. Slowly pull your elbows back behind you. 11. Hold for __________ seconds. 12. Slowly return to the starting position. Repeat __________ times. Complete this exercise __________ times a day. Exercise K:Shoulder Press-Ups  7. Sit in a stable chair that has armrests. Sit upright, with your feet flat on the floor. 8. Put your hands on the armrests so your elbows are bent and your fingers are pointing forward.  Your hands should be about even with the sides of your body. 9. Push down on the armrests and use your arms to lift yourself off of the chair. Straighten your elbows and lift yourself up as much as you comfortably can.  Move your shoulder blades down, and avoid letting your shoulders move up toward your ears.  Keep your feet on the ground. As you get stronger, your feet should support less of your body weight as you lift yourself up. 10. Hold for __________ seconds. 11. Slowly lower yourself back into the chair. Repeat __________ times. Complete this exercise __________ times a day. Exercise L: Wall Push-Ups  6. Stand so you are facing a stable wall. Your feet should be about one arm-length away from the wall. 7. Lean forward and place your palms on the wall at shoulder height. 8. Keep your feet flat on the floor as you bend your elbows and lean forward toward the wall. 9. Hold for __________ seconds. 10. Straighten your elbows to push yourself back to the starting position. Repeat __________ times. Complete this exercise __________ times a day. This information is not intended to replace advice given to you by your health care provider. Make sure you discuss any questions you have with your health care provider. Document Released: 12/19/2004 Document Revised: 10/30/2015 Document Reviewed: 10/16/2014 [employer] Interactive Patient Education  2017 [employer]  

## 2016-01-23 NOTE — Progress Notes (Signed)
Office Visit Note  Patient: David [name]             Date of Birth: 1926/07/01           MRN: [id number]             PCP: Georgann Housekeeper, MD Referring: Georgann Housekeeper, MD Visit Date: 01/23/2016 Occupation: @GUAROCC @    Subjective:     History of Present Illness: Teja Judice is a 82 y.o. male with history of osteoarthritis. He was accompanied by has some wife and daughter who were main historian. He suffers from Alzheimer's and is unable to express much of his health issues. According to his daughter he is been having difficulty walking and getting out of the chair due to knee joint discomfort. It is very stiff in the morning which can last 1 or 2 hours. His daughter was a rheumatologist came in August and injected her knee joints with the cortisone which helped him a lot. They want him to have repeat cortisone injections to his knee joints. They've also noticed that he does not move his left shoulder very well.  Activities of Daily Living:  Patient reports morning stiffness for 2 hours.   Patient Denies nocturnal pain.  Difficulty dressing/grooming: Reports Difficulty climbing stairs: Reports Difficulty getting out of chair: Reports Difficulty using hands for taps, buttons, cutlery, and/or writing: Denies   Review of Systems  Constitutional: Positive for fatigue. Negative for night sweats and weakness ( ).  HENT: Negative for mouth sores, mouth dryness and nose dryness.   Eyes: Negative for redness and dryness.  Respiratory: Negative for shortness of breath and difficulty breathing.   Cardiovascular: Negative for chest pain, palpitations, hypertension, irregular heartbeat and swelling in legs/feet.  Gastrointestinal: Positive for constipation. Negative for diarrhea.  Endocrine: Negative for increased urination.  Musculoskeletal: Positive for arthralgias, joint pain, myalgias, morning stiffness and myalgias. Negative for joint swelling, muscle weakness and muscle tenderness.    Skin: Negative for color change, rash, hair loss, nodules/bumps, skin tightness, ulcers and sensitivity to sunlight.  Allergic/Immunologic: Negative for susceptible to infections.  Neurological: Negative for dizziness, fainting, memory loss and night sweats.  Hematological: Negative for swollen glands.  Psychiatric/Behavioral: Negative for depressed mood and sleep disturbance. The patient is not nervous/anxious.     PMFS History:  Patient Active Problem List   Diagnosis Date Noted  . CRI (chronic renal insufficiency) 05/03/2015  . Alzheimer's disease 08/31/2014  . Agitation 08/31/2014  . Dementia 08/31/2014  . Onychomycosis 01/28/2014  . Dementia in Alzheimer's disease 03/24/2013  . Nonspecific abnormal results of cardiovascular function study 03/02/2013  . Shortness of breath 03/02/2013  . Essential hypertension, benign 01/08/2013    Past Medical History:  Diagnosis Date  . Alzheimer disease   . CKD (chronic kidney disease), stage III   . Colon polyp 2007  . Elevated PSA    4-7 in 2009  . Glaucoma   . Hernia, inguinal   . High cholesterol   . Hypercholesteremia   . Hyperlipemia   . Hypertension    grade 1 diastolic dysfunction, trace MR and TR, mild LVH Echo 3-/4  . Low back pain   . Macular degeneration   . Memory deficit   . Prostate disease   . Urinary incontinence     Family History  Problem Relation Age of Onset  . Dementia Mother   . Cancer Daughter   . Stroke Sister     [id number]  . Dementia Sister   . Heart attack  Neg Hx   . Hypertension Neg Hx    Past Surgical History:  Procedure Laterality Date  . GALLBLADDER SURGERY  1996   Social History   Social History Narrative   Patient is married with 4 children.   Patient is right handed.   Patient has college education.   Caffeine Use: 3 cups daily      Objective: Vital Signs: BP 133/75 (BP Location: Left Arm, Patient Position: Sitting, Cuff Size: Large)   Pulse 70   Resp 14   Ht 5\' 6"  (1.676 m)   Wt  178 lb (80.7 kg)   BMI 28.73 kg/m    Physical Exam  Constitutional: He is oriented to person, place, and time. He appears well-developed and well-nourished.  HENT:  Head: Normocephalic and atraumatic.  Eyes: Conjunctivae and EOM are normal. Pupils are equal, round, and reactive to light.  Neck: Normal range of motion. Neck supple.  Cardiovascular: Normal rate, regular rhythm and normal heart sounds.   Pulmonary/Chest: Effort normal and breath sounds normal.  Abdominal: Soft. Bowel sounds are normal.  Neurological: He is alert and oriented to person, place, and time.  Skin: Skin is warm and dry. Capillary refill takes less than 2 seconds.  Bilateral moderate pedal edema  Psychiatric: He has a normal mood and affect. His behavior is normal.  Nursing note and vitals reviewed.    Musculoskeletal Exam: C-spine good range of motion, he has thoracic kyphosis., His left shoulder has very limited range of motion, right shoulder joint for range of motion, elbow joints, wrist joints, MCPs PIPs DIPs with good range of motion with no synovitis. Hip joints, knee joints are good range of motion with no synovitis. Discomfort with range of motion of bilateral knee joints.  CDAI Exam: No CDAI exam completed.    Investigation: No additional findings.   Imaging: No results found.  Speciality Comments: No specialty comments available.    Procedures:  Large Joint Inj Date/Time: 01/23/2016 2:53 PM Performed by: Pollyann SavoyEVESHWAR, Adriel Desrosier Authorized by: Pollyann SavoyEVESHWAR, Nicasio Barlowe   Consent Given by:  Patient Site marked: the procedure site was marked   Timeout: prior to procedure the correct patient, procedure, and site was verified   Indications:  Pain and joint swelling Location:  Knee Site:  R knee Prep: patient was prepped and draped in usual sterile fashion   Needle Size:  27 G Needle Length:  1.5 inches Approach:  Medial Ultrasound Guidance: No   Fluoroscopic Guidance: No   Arthrogram: No     Medications:  1.5 mL lidocaine 1 %; 40 mg triamcinolone acetonide 40 MG/ML Aspiration Attempted: Yes   Aspirate amount (mL):  0 Patient tolerance:  Patient tolerated the procedure well with no immediate complications Large Joint Inj Date/Time: 01/23/2016 2:54 PM Performed by: Pollyann SavoyEVESHWAR, Laguana Desautel Authorized by: Pollyann SavoyEVESHWAR, Nakenya Theall   Consent Given by:  Patient Site marked: the procedure site was marked   Timeout: prior to procedure the correct patient, procedure, and site was verified   Indications:  Pain and joint swelling Location:  Knee Site:  L knee Prep: patient was prepped and draped in usual sterile fashion   Needle Size:  27 G Needle Length:  1.5 inches Approach:  Medial Ultrasound Guidance: No   Fluoroscopic Guidance: No   Arthrogram: No   Medications:  1.5 mL lidocaine 1 %; 40 mg triamcinolone acetonide 40 MG/ML Aspiration Attempted: Yes   Aspirate amount (mL):  0 Patient tolerance:  Patient tolerated the procedure well with no immediate complications  Allergies: Coreg [carvedilol] and Norvasc [amlodipine besylate]   Assessment / Plan:     Visit Diagnoses: Chronic pain of both knees: He continues to have some pain and discomfort in his bilateral knee joints for some time. He had good response to cortisone injection which was given by his daughter in August. Per request of his daughter and him we decided to go ahead and proceed with the cortisone injection to bilateral knee joints. He tolerated the procedure well as described above. I also discussed knee joint exercises and a handout was given. I offered x-rays of the knee joints but they declined.  Chronic left shoulder pain: He has left frozen shoulder. I could move his left shoulder passively it was difficult for him to cooperate due to underlying Alzheimer's disease. I've given him handout on exercises for left shoulder which she can do at home with the caregivers.   Essential hypertension, benign: I have advised him to  monitor blood pressure closely.  His other medical problems are as follows:   Alzheimer's dementia with behavioral disturbance, unspecified timing of dementia onset  Chronic renal impairment, stage 3 (moderate)    Orders: Orders Placed This Encounter  Procedures  . Large Joint Injection/Arthrocentesis  . Large Joint Injection/Arthrocentesis   No orders of the defined types were placed in this encounter.   Face-to-face time spent with patient was 30 minutes. 50% of time was spent in counseling and coordination of care.  Follow-Up Instructions: Return in about 4 months (around 05/23/2016) for Osteoarthritis.   Pollyann SavoyShaili Rie Mcneil, MD

## 2016-02-07 ENCOUNTER — Ambulatory Visit: Payer: Self-pay | Admitting: Surgery

## 2016-02-21 ENCOUNTER — Ambulatory Visit: Payer: Medicare Other | Admitting: Rheumatology

## 2016-02-27 ENCOUNTER — Other Ambulatory Visit: Payer: Self-pay | Admitting: Neurology

## 2016-02-29 ENCOUNTER — Other Ambulatory Visit: Payer: Self-pay

## 2016-02-29 MED ORDER — QUETIAPINE FUMARATE 25 MG PO TABS: 12.5000 mg | ORAL_TABLET | Freq: Two times a day (BID) | ORAL | 0 refills | Status: DC

## 2016-03-19 ENCOUNTER — Ambulatory Visit: Payer: Medicare Other | Admitting: Neurology

## 2016-04-17 DIAGNOSIS — G8929 Other chronic pain: Secondary | ICD-10-CM | POA: Insufficient documentation

## 2016-04-17 DIAGNOSIS — M25561 Pain in right knee: Principal | ICD-10-CM

## 2016-04-17 DIAGNOSIS — M25562 Pain in left knee: Principal | ICD-10-CM

## 2016-04-17 DIAGNOSIS — M25512 Pain in left shoulder: Secondary | ICD-10-CM

## 2016-04-17 NOTE — Progress Notes (Signed)
Office Visit Note  Patient: David [name]             Date of Birth: May 03, 1926           MRN: [id number]             PCP: Georgann Housekeeper, MD Referring: Georgann Housekeeper, MD Visit Date: 04/18/2016 Occupation: @GUAROCC @    Subjective:  Pain of the Right Knee and Pain of the Left Knee   History of Present Illness: David [name] is a 83 y.o. male with history of osteoarthritis and Alzheimer's. He was accompanied by his wife today. According to his wife he had some relief after the last cortisone injection and now he is having progressive difficulty getting up from the chair and bed. She wants him to have cortisone injection. We have been discussion that it has not been 3 months since the last cortisone injection. As it is difficult for him to get out of the bed and get to the office she requested that we should inject his knees today. He does not express much which other joints are painful.  Activities of Daily Living:  Patient reports morning stiffness for all day hours.   Patient Reports nocturnal pain.  Difficulty dressing/grooming: Denies Difficulty climbing stairs: Reports Difficulty getting out of chair: Reports Difficulty using hands for taps, buttons, cutlery, and/or writing: Denies   Review of Systems  Constitutional: Positive for fatigue. Negative for night sweats and weakness ( ).  HENT: Negative for mouth sores, mouth dryness and nose dryness.   Eyes: Negative for redness and dryness.  Respiratory: Negative for shortness of breath and difficulty breathing.   Cardiovascular: Negative for chest pain, palpitations, hypertension, irregular heartbeat and swelling in legs/feet.  Gastrointestinal: Negative for constipation and diarrhea.  Endocrine: Negative for increased urination.  Musculoskeletal: Positive for arthralgias, joint pain, myalgias, morning stiffness and myalgias. Negative for joint swelling, muscle weakness and muscle tenderness.  Skin: Negative for color change,  rash, hair loss, nodules/bumps, skin tightness, ulcers and sensitivity to sunlight.  Allergic/Immunologic: Negative for susceptible to infections.  Neurological: Negative for dizziness, fainting, memory loss and night sweats.  Hematological: Negative for swollen glands.  Psychiatric/Behavioral: Negative for depressed mood and sleep disturbance. The patient is not nervous/anxious.        Dementia    PMFS History:  Patient Active Problem List   Diagnosis Date Noted  . Chronic pain of both knees 04/17/2016  . Chronic left shoulder pain 04/17/2016  . CRI (chronic renal insufficiency) 05/03/2015  . Alzheimer's disease 08/31/2014  . Agitation 08/31/2014  . Dementia 08/31/2014  . Onychomycosis 01/28/2014  . Dementia in Alzheimer's disease 03/24/2013  . Nonspecific abnormal results of cardiovascular function study 03/02/2013  . Shortness of breath 03/02/2013  . Essential hypertension, benign 01/08/2013    Past Medical History:  Diagnosis Date  . Alzheimer disease   . CKD (chronic kidney disease), stage III   . Colon polyp 2007  . Elevated PSA    4-7 in 2009  . Glaucoma   . Hernia, inguinal   . High cholesterol   . Hypercholesteremia   . Hyperlipemia   . Hypertension    grade 1 diastolic dysfunction, trace MR and TR, mild LVH Echo 3-/4  . Low back pain   . Macular degeneration   . Memory deficit   . Prostate disease   . Urinary incontinence     Family History  Problem Relation Age of Onset  . Dementia Mother   . Cancer Daughter   .  Stroke Sister     61X2  . Dementia Sister   . Heart attack Neg Hx   . Hypertension Neg Hx    Past Surgical History:  Procedure Laterality Date  . GALLBLADDER SURGERY  1996   Social History   Social History Narrative   Patient is married with 4 children.   Patient is right handed.   Patient has college education.   Caffeine Use: 3 cups daily      Objective: Vital Signs: BP 120/68   Pulse (!) 50 Comment: irregular  Resp 14   Wt 182  lb (82.6 kg)   BMI 29.38 kg/m    Physical Exam  Constitutional: He appears well-developed and well-nourished.  HENT:  Head: Normocephalic and atraumatic.  Eyes: Conjunctivae and EOM are normal. Pupils are equal, round, and reactive to light.  Neck: Normal range of motion. Neck supple.  Cardiovascular: Normal rate, regular rhythm and normal heart sounds.   Pulmonary/Chest: Effort normal and breath sounds normal.  Abdominal: Soft. Bowel sounds are normal.  Neurological: He is alert.  Dementia and Alzheimer's  Skin: Skin is warm and dry. Capillary refill takes less than 2 seconds.  Psychiatric: He has a normal mood and affect. His behavior is normal.  Nursing note and vitals reviewed.    Musculoskeletal Exam:  C-spine and thoracic lumbar spine good range of motion. Shoulder joints although joints wrist joint MCPs PIPs with good range of motion with no synovitis. Hip joints knee joints ankles MTPs PIPs with good range of motion with no synovitis. He had difficulty with mobility. CDAI Exam: No CDAI exam completed.    Investigation: No additional findings.    Imaging: Xr Knee 3 View Left  Result Date: 04/18/2016 Severe lateral compartment narrowing with lateral osteophytes. Severe chondromalacia patella. No chondrocalcinosis. Impression: These findings are consistent with severe osteoarthritis and severe chondromalacia patella  Xr Knee 3 View Right  Result Date: 04/18/2016 Severe lateral compartment narrowing with lateral osteophytes. Severe chondromalacia patella. No chondrocalcinosis. Impression: These findings are consistent with severe osteoarthritis and severe chondromalacia patella   Speciality Comments: No specialty comments available.    Procedures:  Large Joint Inj Date/Time: 04/18/2016 9:41 AM Performed by: Pollyann SavoyEVESHWAR, Anthea Udovich Authorized by: Pollyann SavoyEVESHWAR, Dezmon Conover   Consent Given by:  Patient Site marked: the procedure site was marked   Timeout: prior to procedure the  correct patient, procedure, and site was verified   Indications:  Pain and joint swelling Location:  Knee Site:  R knee Prep: patient was prepped and draped in usual sterile fashion   Needle Size:  27 G Needle Length:  1.5 inches Approach:  Medial Ultrasound Guidance: No   Fluoroscopic Guidance: No   Arthrogram: No   Medications:  1.5 mL lidocaine 1 %; 40 mg triamcinolone acetonide 40 MG/ML Aspiration Attempted: Yes   Aspirate amount (mL):  0 Patient tolerance:  Patient tolerated the procedure well with no immediate complications Large Joint Inj Date/Time: 04/18/2016 9:42 AM Performed by: Pollyann SavoyEVESHWAR, Marieli Rudy Authorized by: Pollyann SavoyEVESHWAR, Othell Diluzio   Consent Given by:  Patient Site marked: the procedure site was marked   Timeout: prior to procedure the correct patient, procedure, and site was verified   Indications:  Pain and joint swelling Location:  Knee Site:  L knee Prep: patient was prepped and draped in usual sterile fashion   Needle Size:  27 G Needle Length:  1.5 inches Approach:  Medial Ultrasound Guidance: No   Fluoroscopic Guidance: No   Arthrogram: No   Medications:  1.5  mL lidocaine 1 %; 40 mg triamcinolone acetonide 40 MG/ML Aspiration Attempted: Yes   Aspirate amount (mL):  0 Patient tolerance:  Patient tolerated the procedure well with no immediate complications   Allergies: Coreg [carvedilol] and Norvasc [amlodipine besylate]   Assessment / Plan:     Visit Diagnoses: Chronic left shoulder pain - Frozen shoulder  Chronic pain of both knees - Cortisone injection to bilateral knee joints 01/23/2016. According to his 5 he has to have injections every few months to keep the moment. He has some Alzheimer's and cannot express himself much.  Alzheimer's dementia with behavioral disturbance, unspecified timing of dementia onset  History of chronic kidney disease  History of hypertension    Orders: Orders Placed This Encounter  Procedures  . Large Joint  Injection/Arthrocentesis  . Large Joint Injection/Arthrocentesis  . XR KNEE 3 VIEW RIGHT  . XR KNEE 3 VIEW LEFT   No orders of the defined types were placed in this encounter.   Face-to-face time spent with patient was 30 minutes. 50% of time was spent in counseling and coordination of care.  Follow-Up Instructions: Return in about 3 months (around 07/19/2016) for Osteoarthritis.   Pollyann Savoy, MD  Note - This record has been created using Animal nutritionist.  Chart creation errors have been sought, but may not always  have been located. Such creation errors do not reflect on  the standard of medical care.

## 2016-04-18 ENCOUNTER — Ambulatory Visit (INDEPENDENT_AMBULATORY_CARE_PROVIDER_SITE_OTHER): Payer: Self-pay

## 2016-04-18 ENCOUNTER — Ambulatory Visit (INDEPENDENT_AMBULATORY_CARE_PROVIDER_SITE_OTHER): Payer: Medicare Other

## 2016-04-18 ENCOUNTER — Ambulatory Visit (INDEPENDENT_AMBULATORY_CARE_PROVIDER_SITE_OTHER): Payer: Medicare Other | Admitting: Rheumatology

## 2016-04-18 ENCOUNTER — Encounter: Payer: Self-pay | Admitting: Rheumatology

## 2016-04-18 VITALS — BP 120/68 | HR 50 | Resp 14 | Wt 182.0 lb

## 2016-04-18 DIAGNOSIS — G8929 Other chronic pain: Secondary | ICD-10-CM

## 2016-04-18 DIAGNOSIS — M25512 Pain in left shoulder: Secondary | ICD-10-CM

## 2016-04-18 DIAGNOSIS — G308 Other Alzheimer's disease: Secondary | ICD-10-CM

## 2016-04-18 DIAGNOSIS — M25562 Pain in left knee: Secondary | ICD-10-CM

## 2016-04-18 DIAGNOSIS — F0281 Dementia in other diseases classified elsewhere with behavioral disturbance: Secondary | ICD-10-CM

## 2016-04-18 DIAGNOSIS — Z8679 Personal history of other diseases of the circulatory system: Secondary | ICD-10-CM

## 2016-04-18 DIAGNOSIS — M25561 Pain in right knee: Secondary | ICD-10-CM | POA: Diagnosis not present

## 2016-04-18 DIAGNOSIS — G309 Alzheimer's disease, unspecified: Secondary | ICD-10-CM

## 2016-04-18 DIAGNOSIS — Z87448 Personal history of other diseases of urinary system: Secondary | ICD-10-CM

## 2016-04-18 MED ORDER — TRIAMCINOLONE ACETONIDE 40 MG/ML IJ SUSP
40.0000 mg | INTRAMUSCULAR | Status: AC | PRN
  Administered 2016-04-18: 40 mg via INTRA_ARTICULAR

## 2016-04-18 MED ORDER — LIDOCAINE HCL 1 % IJ SOLN
1.5000 mL | INTRAMUSCULAR | Status: AC | PRN
  Administered 2016-04-18: 1.5 mL

## 2016-04-25 ENCOUNTER — Telehealth: Payer: Self-pay

## 2016-04-25 ENCOUNTER — Other Ambulatory Visit: Payer: Self-pay

## 2016-04-25 MED ORDER — DONEPEZIL HCL 10 MG PO TABS: 10.0000 mg | ORAL_TABLET | Freq: Every day | ORAL | 3 refills | Status: DC

## 2016-04-25 NOTE — Telephone Encounter (Signed)
[facility] [phone]. Rn stated a fax was receive that the aricpet 10mg  was denied. The pharmacy rep stated the insurance does not want to pay for the brand name. Rn stated the medication can be change to plain generic donepezil(arciept). Rn change to generic aricept for approval. Rn stated to pharmacy rep that [facility] does not carry samples.Order redone.

## 2016-04-26 NOTE — Telephone Encounter (Signed)
LMOM (identified vm) that per Katrina's note, insurance will not cover brand name, only generic.  They have the option of  paying cash for brand name   [facility] and available to be filled/fim

## 2016-04-26 NOTE — Telephone Encounter (Addendum)
Pt's daughter called said she wants pt to have brand name aricept only. He is out of the medication and would like this take care of today. Please call her at [phone]

## 2016-04-30 ENCOUNTER — Ambulatory Visit: Payer: Medicare Other | Admitting: Rheumatology

## 2016-05-02 ENCOUNTER — Emergency Department (HOSPITAL_COMMUNITY): Payer: Medicare Other

## 2016-05-02 ENCOUNTER — Encounter (HOSPITAL_COMMUNITY): Payer: Self-pay

## 2016-05-02 ENCOUNTER — Emergency Department (HOSPITAL_COMMUNITY)
Admission: EM | Admit: 2016-05-02 | Discharge: 2016-05-02 | Disposition: A | Discharge status: Home / Self Care | Payer: Medicare Other | Attending: Emergency Medicine | Admitting: Emergency Medicine

## 2016-05-02 DIAGNOSIS — I129 Hypertensive chronic kidney disease with stage 1 through stage 4 chronic kidney disease, or unspecified chronic kidney disease: Secondary | ICD-10-CM | POA: Diagnosis not present

## 2016-05-02 DIAGNOSIS — W19XXXA Unspecified fall, initial encounter: Secondary | ICD-10-CM

## 2016-05-02 DIAGNOSIS — Y999 Unspecified external cause status: Secondary | ICD-10-CM | POA: Diagnosis not present

## 2016-05-02 DIAGNOSIS — N183 Chronic kidney disease, stage 3 (moderate): Secondary | ICD-10-CM | POA: Insufficient documentation

## 2016-05-02 DIAGNOSIS — S6991XA Unspecified injury of right wrist, hand and finger(s), initial encounter: Secondary | ICD-10-CM

## 2016-05-02 DIAGNOSIS — S062X0A Diffuse traumatic brain injury without loss of consciousness, initial encounter: Secondary | ICD-10-CM | POA: Diagnosis not present

## 2016-05-02 DIAGNOSIS — G309 Alzheimer's disease, unspecified: Secondary | ICD-10-CM | POA: Diagnosis not present

## 2016-05-02 DIAGNOSIS — Z7982 Long term (current) use of aspirin: Secondary | ICD-10-CM | POA: Insufficient documentation

## 2016-05-02 DIAGNOSIS — Y9289 Other specified places as the place of occurrence of the external cause: Secondary | ICD-10-CM | POA: Diagnosis not present

## 2016-05-02 DIAGNOSIS — Z79899 Other long term (current) drug therapy: Secondary | ICD-10-CM | POA: Insufficient documentation

## 2016-05-02 DIAGNOSIS — S0121XA Laceration without foreign body of nose, initial encounter: Secondary | ICD-10-CM | POA: Diagnosis not present

## 2016-05-02 DIAGNOSIS — S60221A Contusion of right hand, initial encounter: Secondary | ICD-10-CM | POA: Diagnosis not present

## 2016-05-02 DIAGNOSIS — W1839XA Other fall on same level, initial encounter: Secondary | ICD-10-CM | POA: Diagnosis not present

## 2016-05-02 DIAGNOSIS — S0990XA Unspecified injury of head, initial encounter: Secondary | ICD-10-CM | POA: Diagnosis present

## 2016-05-02 DIAGNOSIS — S0993XA Unspecified injury of face, initial encounter: Secondary | ICD-10-CM

## 2016-05-02 DIAGNOSIS — Y9301 Activity, walking, marching and hiking: Secondary | ICD-10-CM | POA: Insufficient documentation

## 2016-05-02 DIAGNOSIS — S0083XA Contusion of other part of head, initial encounter: Secondary | ICD-10-CM

## 2016-05-02 LAB — BASIC METABOLIC PANEL
Anion gap: 8 (ref 5–15)
BUN: 25 mg/dL — AB (ref 6–20)
CALCIUM: 8.6 mg/dL — AB (ref 8.9–10.3)
CO2: 29 mmol/L (ref 22–32)
CREATININE: 1.28 mg/dL — AB (ref 0.61–1.24)
Chloride: 103 mmol/L (ref 101–111)
GFR calc Af Amer: 56 mL/min — ABNORMAL LOW (ref >60)
GFR, EST NON AFRICAN AMERICAN: 49 mL/min — AB (ref >60)
Glucose, Bld: 99 mg/dL (ref 65–99)
Potassium: 4 mmol/L (ref 3.5–5.1)
Sodium: 140 mmol/L (ref 135–145)

## 2016-05-02 LAB — I-STAT CHEM 8, ED
BUN: 40 mg/dL — ABNORMAL HIGH (ref 6–20)
CREATININE: 1.3 mg/dL — AB (ref 0.61–1.24)
Calcium, Ion: 1.1 mmol/L — ABNORMAL LOW (ref 1.15–1.40)
Chloride: 102 mmol/L (ref 101–111)
GLUCOSE: 96 mg/dL (ref 65–99)
HEMATOCRIT: 39 % (ref 39.0–52.0)
HEMOGLOBIN: 13.3 g/dL (ref 13.0–17.0)
Potassium: 6.2 mmol/L — ABNORMAL HIGH (ref 3.5–5.1)
Sodium: 140 mmol/L (ref 135–145)
TCO2: 34 mmol/L (ref 0–100)

## 2016-05-02 LAB — CBC WITH DIFFERENTIAL/PLATELET
BASOS ABS: 0 10*3/uL (ref 0.0–0.1)
BASOS PCT: 0 %
EOS PCT: 2 %
Eosinophils Absolute: 0.2 10*3/uL (ref 0.0–0.7)
HCT: 40.5 % (ref 39.0–52.0)
Hemoglobin: 13.1 g/dL (ref 13.0–17.0)
Lymphocytes Relative: 8 %
Lymphs Abs: 0.9 10*3/uL (ref 0.7–4.0)
MCH: 28.6 pg (ref 26.0–34.0)
MCHC: 32.3 g/dL (ref 30.0–36.0)
MCV: 88.4 fL (ref 78.0–100.0)
MONO ABS: 1 10*3/uL (ref 0.1–1.0)
Monocytes Relative: 9 %
Neutro Abs: 9.2 10*3/uL — ABNORMAL HIGH (ref 1.7–7.7)
Neutrophils Relative %: 81 %
PLATELETS: 213 10*3/uL (ref 150–400)
RBC: 4.58 MIL/uL (ref 4.22–5.81)
RDW: 14.8 % (ref 11.5–15.5)
WBC: 11.3 10*3/uL — ABNORMAL HIGH (ref 4.0–10.5)

## 2016-05-02 MED ORDER — ACETAMINOPHEN 325 MG PO TABS
650.0000 mg | ORAL_TABLET | Freq: Once | ORAL | Status: AC
  Administered 2016-05-02: 650 mg via ORAL
  Filled 2016-05-02: qty 2

## 2016-05-02 MED ORDER — LIDOCAINE HCL 2 % IJ SOLN
10.0000 mL | Freq: Once | INTRAMUSCULAR | Status: AC
  Administered 2016-05-02: 200 mg via INTRADERMAL
  Filled 2016-05-02: qty 20

## 2016-05-02 MED ORDER — ACETAMINOPHEN 500 MG PO TABS: 500.0000 mg | ORAL_TABLET | Freq: Four times a day (QID) | ORAL | 0 refills | Status: AC | PRN

## 2016-05-02 NOTE — Discharge Instructions (Signed)
You have a broken nose, a small bruise in your brain, a loose tooth and a right hand sprain.  Please take tylenol as needed for pain.  Follow up with your dentist for your dental care.  Follow up with your doctor in 5 days for wound recheck and sutures removal.  Keep wound clean and dry for the first 12-16 hrs.  After that, please change dressing daily and monitor for any sign of infection.  If you develop increase headache, vomiting, new numbness or weakness please return to the ER for further care.

## 2016-05-02 NOTE — ED Notes (Signed)
Patient transported to CT 

## 2016-05-02 NOTE — ED Triage Notes (Signed)
Pt with history of dementia fell while walking today no LOC has cut to bridge of nose and R nare area

## 2016-05-02 NOTE — ED Notes (Signed)
Patient Alert and oriented to baseline Stable and ambulatory. Patient verbalized understanding of the discharge instructions.  Patient belongings were taken by the patient.

## 2016-05-02 NOTE — Progress Notes (Deleted)
Patient ID: David [name], male   DOB: 1927/02/02, 83 y.o.   MRN: [id number]     Cardiology Office Note   Date:  05/02/2016   ID:  David [name], DOB [birth year], MRN [id number]  PCP:  David Housekeeper, MD    No chief complaint on file. abnormal nuclear stress test   Wt Readings from Last 3 Encounters:  05/02/16 182 lb (82.6 kg)  04/18/16 182 lb (82.6 kg)  01/23/16 178 lb (80.7 kg)       History of Present Illness: David [name] is a 83 y.o. male  With dementia who had an abnormal stress test several years ago and anginal symptoms. Due to renal insufficiency, he has been managed medically. He has multiple risk factors for heart disease.  He has not been walking much. His wife states that he is just getting more sedentary. His dementia is also progressing. He has not used any nitroglycerin under his tongue. He tries to stay out of the cold weather. He does not complain much.   Incontinence has been there most troubling issue. They tried to back off his Lasix but then the lower extremity swelling worsened to the point he couldn't get his shoes on. He is back to taking Lasix regularly. Swelling is under control.    Past Medical History:  Diagnosis Date  . Alzheimer disease   . CKD (chronic kidney disease), stage III   . Colon polyp 2007  . Elevated PSA    4-7 in 2009  . Glaucoma   . Hernia, inguinal   . High cholesterol   . Hypercholesteremia   . Hyperlipemia   . Hypertension    grade 1 diastolic dysfunction, trace MR and TR, mild LVH Echo 3-/4  . Low back pain   . Macular degeneration   . Memory deficit   . Prostate disease   . Urinary incontinence     Past Surgical History:  Procedure Laterality Date  . GALLBLADDER SURGERY  1996     Current Outpatient Prescriptions  Medication Sig Dispense Refill  . acetaminophen (TYLENOL) 500 MG tablet Take 1 tablet (500 mg total) by mouth every 6 (six) hours as needed for mild pain or moderate pain. 30 tablet 0  .  amLODipine (NORVASC) 2.5 MG tablet Take 1 tablet by mouth in the evening  11  . ARICEPT 10 MG tablet TAKE 1 TABLET BY MOUTH EVERY DAY 90 tablet 2  . B Complex-C (SUPER B COMPLEX PO) Take 1 tablet by mouth daily.     . brimonidine (ALPHAGAN) 0.15 % ophthalmic solution 1 drop 3 (three) times daily. As directed    . Cholecalciferol (VITAMIN D-3) 1000 UNITS CAPS Take 1,000 Units by mouth daily.    Marland Kitchen donepezil (ARICEPT) 10 MG tablet Take 1 tablet (10 mg total) by mouth at bedtime. 90 tablet 3  . fish oil-omega-3 fatty acids 1000 MG capsule Take 1 g by mouth daily.     . folic acid (FOLVITE) 1 MG tablet Take 1 mg by mouth daily.    . furosemide (LASIX) 20 MG tablet Take 1 tablet (20 mg total) by mouth 2 (two) times daily. 60 tablet 11  . isosorbide mononitrate (IMDUR) 30 MG 24 hr tablet TAKE 1 TABLET BY MOUTH EVERY DAY 90 tablet 1  . NAMENDA 10 MG tablet TAKE 1 TABLET BY MOUTH TWICE DAILY 60 tablet 11  . NITROSTAT 0.6 MG SL tablet Place 0.6 mg under the tongue every 5 (five) minutes as needed for chest pain.     Marland Kitchen  omeprazole (PRILOSEC) 20 MG capsule Take 20 mg by mouth daily.    . QUEtiapine (SEROQUEL) 25 MG tablet TAKE 1 TABLET BY MOUTH TWICE DAILY (Patient not taking: Reported on 04/18/2016) 180 tablet 0  . QUEtiapine (SEROQUEL) 25 MG tablet Take 0.5 tablets (12.5 mg total) by mouth 2 (two) times daily. 60 tablet 0  . tamsulosin (FLOMAX) 0.4 MG CAPS capsule Take 0.8 mg by mouth every evening.      No current facility-administered medications for this visit.     Allergies:   Coreg [carvedilol] and Norvasc [amlodipine besylate]    Social History:  The patient  reports that he has never smoked. He has never used smokeless tobacco. He reports that he does not drink alcohol or use drugs.   Family History:  The patient's family history includes Cancer in his daughter; Dementia in his mother and sister; Stroke in his sister.    ROS:  Please see the history of present illness.   Otherwise, review of  systems are positive for Lower extremity edema, progressive memory loss from Alzheimer's.   All other systems are reviewed and negative.    PHYSICAL EXAM: VS:  There were no vitals taken for this visit. , BMI There is no height or weight on file to calculate BMI. GEN: Well nourished, well developed, in no acute distress  HEENT: normal  Neck: no JVD, carotid bruits, or masses Cardiac: RRR; no murmurs, rubs, or gallops, bilateral pitting lower extremity edema  Respiratory:  clear to auscultation bilaterally, normal work of breathing GI: soft, nontender, nondistended, + BS MS: no deformity or atrophy  Skin: warm and dry, no rash Neuro:  Strength and sensation are intact Psych: euthymic mood, full affect   Recent Labs: 05/02/2016: BUN 25; Creatinine, Ser 1.28; Hemoglobin 13.1; Platelets 213; Potassium 4.0; Sodium 140   Lipid Panel No results found for: CHOL, TRIG, HDL, CHOLHDL, VLDL, LDLCALC, LDLDIRECT   Other studies Reviewed: Additional studies/ records that were reviewed today with results demonstrating: Prior nuclear study results.   ASSESSMENT AND PLAN:  1. CAD/abnormal stress test: he is not limited by symptoms at this point , partially because he is not very active. He is more limited by his Alzheimer's disease. Would not pursue any invasive testing at this time.  The wife feels that his angina is controlled. The patient is not complaining of anything. I would recommend for him to try to walk at least 150 minutes a week, in a safe environment.  He is not a good candidate for invasive testing.  COuld increase Imdur if he had more sx. 2. hyperlipidemia : Managed by Dr. Eula ListenHussain. 3. hypertension: Blood pressure well controlled.  Continue current meds.  4. Chronic renal insufficiency: Managed by Dr. Eula ListenHussain.  Tryibg to avoid angiography to prevent risk to kidneys.  5. Edema: Lasix was changed as needed due to incontinence. However, his leg swelling worsened. He is now taking the Lasix  regularly and they're dealing with the incontinence problem. Edema still present but better than before.   Current medicines are reviewed at length with the patient today.  The patient concerns regarding his medicines were addressed.  The following changes have been made:  No change  Labs/ tests ordered today include:  No orders of the defined types were placed in this encounter.   Recommend 150 minutes/week of aerobic exercise Low fat, low carb, high fiber diet recommended  Disposition:   FU in one year   Signed, Lance MussJayadeep Payslee Bateson, MD  05/02/2016 10:41  PM    [facility] [place], [place], [place]  [address] Phone: [phone]; Fax: [phone]

## 2016-05-02 NOTE — ED Provider Notes (Signed)
[facility]-EMERGENCY DEPT Provider Note   CSN: [id number] Arrival date & time: 05/02/16  1256     History   Chief Complaint Chief Complaint  Patient presents with  . Fall    pt fell today no LOC has an abrasion noted to bridge of nose and has pain to the R hand no deformity     HPI David [name] is a 83 y.o. male.  HPI   83 year old male with history of Alzheimer disease, chronic bilateral knee pain, dementia, hypertension, kidney disease brought in by family via ambulance for evaluation of a fall. History obtained through son who is at bedside. Patient and his wife was out shopping earlier today. His wife was walking ahead of him but when she turns around she noticed that the patient was on the ground with an apparent fall. Apparently patient struck his face against the ground and there was a moderate amount of bleeding coming from his face and nose. His wife believe patient may have tripped over a curb and fell. No loss of consciousness. Patient with history of dementia, denies any acute pain at this time. He is up-to-date with immunization. He is not on any blood thinner medication aside from a baby aspirin. He has had recent steroid injection in his knees and has been doing well. Currently no specific complaint. Patient has been at his baseline according to his family member.       Past Medical History:  Diagnosis Date  . Alzheimer disease   . CKD (chronic kidney disease), stage III   . Colon polyp 2007  . Elevated PSA    4-7 in 2009  . Glaucoma   . Hernia, inguinal   . High cholesterol   . Hypercholesteremia   . Hyperlipemia   . Hypertension    grade 1 diastolic dysfunction, trace MR and TR, mild LVH Echo 3-/4  . Low back pain   . Macular degeneration   . Memory deficit   . Prostate disease   . Urinary incontinence     Patient Active Problem List   Diagnosis Date Noted  . Chronic pain of both knees 04/17/2016  . Chronic left shoulder pain 04/17/2016  . CRI  (chronic renal insufficiency) 05/03/2015  . Alzheimer's disease 08/31/2014  . Agitation 08/31/2014  . Dementia 08/31/2014  . Onychomycosis 01/28/2014  . Dementia in Alzheimer's disease 03/24/2013  . Nonspecific abnormal results of cardiovascular function study 03/02/2013  . Shortness of breath 03/02/2013  . Essential hypertension, benign 01/08/2013    Past Surgical History:  Procedure Laterality Date  . GALLBLADDER SURGERY  1996       Home Medications    Prior to Admission medications   Medication Sig Start Date End Date Taking? Authorizing Provider  amLODipine (NORVASC) 2.5 MG tablet Take 1 tablet by mouth in the evening 11/16/14   Historical Provider, MD  ARICEPT 10 MG tablet TAKE 1 TABLET BY MOUTH EVERY DAY 10/20/15   Micki RileyPramod S Sethi, MD  aspirin 81 MG tablet Take 81 mg by mouth daily.    Historical Provider, MD  B Complex-C (SUPER B COMPLEX PO) Take 1 tablet by mouth daily.     Historical Provider, MD  brimonidine (ALPHAGAN) 0.15 % ophthalmic solution 1 drop 3 (three) times daily. As directed    Historical Provider, MD  Cholecalciferol (VITAMIN D-3) 1000 UNITS CAPS Take 1,000 Units by mouth daily.    Historical Provider, MD  donepezil (ARICEPT) 10 MG tablet Take 1 tablet (10 mg total) by mouth at  bedtime. 04/25/16   Micki Riley, MD  fish oil-omega-3 fatty acids 1000 MG capsule Take 1 g by mouth daily.     Historical Provider, MD  folic acid (FOLVITE) 1 MG tablet Take 1 mg by mouth daily.    Historical Provider, MD  furosemide (LASIX) 20 MG tablet Take 1 tablet (20 mg total) by mouth 2 (two) times daily. 01/19/16 04/18/16  Corky Crafts, MD  isosorbide mononitrate (IMDUR) 30 MG 24 hr tablet TAKE 1 TABLET BY MOUTH EVERY DAY 11/10/15   Corky Crafts, MD  NAMENDA 10 MG tablet TAKE 1 TABLET BY MOUTH TWICE DAILY 04/24/15   Micki Riley, MD  NITROSTAT 0.6 MG SL tablet Place 0.6 mg under the tongue every 5 (five) minutes as needed for chest pain.  08/23/13   Historical Provider, MD    omeprazole (PRILOSEC) 20 MG capsule Take 20 mg by mouth daily.    Historical Provider, MD  QUEtiapine (SEROQUEL) 25 MG tablet TAKE 1 TABLET BY MOUTH TWICE DAILY Patient not taking: Reported on 04/18/2016 02/29/16   Micki Riley, MD  QUEtiapine (SEROQUEL) 25 MG tablet Take 0.5 tablets (12.5 mg total) by mouth 2 (two) times daily. 02/29/16   Micki Riley, MD  tamsulosin (FLOMAX) 0.4 MG CAPS capsule Take 0.8 mg by mouth every evening.     Historical Provider, MD    Family History Family History  Problem Relation Age of Onset  . Dementia Mother   . Cancer Daughter   . Stroke Sister     X71  . Dementia Sister   . Heart attack Neg Hx   . Hypertension Neg Hx     Social History Social History  Substance Use Topics  . Smoking status: Never Smoker  . Smokeless tobacco: Never Used  . Alcohol use No     Allergies   Coreg [carvedilol] and Norvasc [amlodipine besylate]   Review of Systems Review of Systems  All other systems reviewed and are negative.    Physical Exam Updated Vital Signs BP (!) 158/58 (BP Location: Right Arm)   Pulse 74   Temp 98.4 F (36.9 C) (Oral)   Resp 18   Ht 5\' 7"  (1.702 m)   Wt 82.6 kg   SpO2 96%   BMI 28.51 kg/m   Physical Exam  Constitutional: He appears well-developed and well-nourished. No distress.  HENT:  Head: Normocephalic.  No scalp tenderness. No hemotympanum, no septal hematoma, no malocclusion, no midface tenderness. 2 cm  vertical laceration at the bridge of nose with dry blood noted in bilateral nares.  Bruising to upper lip, loose L upper central incisor without bleeding.    Eyes: Conjunctivae and EOM are normal. Pupils are equal, round, and reactive to light.  Neck: Normal range of motion. Neck supple.  No cervical midline spine tenderness  Cardiovascular: Normal rate and regular rhythm.   Pulmonary/Chest: Effort normal and breath sounds normal.  Abdominal: There is no tenderness.  Musculoskeletal: He exhibits tenderness (R  hand: tenderness to ulnar aspect of hand with overlying bruising.  bruising to distal finger tips.  no gross deformity.  R wrist with FROM, radial pulse 2+).  R elbow nontender on exam.    Neurological: He is alert. GCS eye subscore is 4. GCS verbal subscore is 1. GCS motor subscore is 6.  Skin: No rash noted.  Psychiatric: He has a normal mood and affect.  Nursing note and vitals reviewed.    ED Treatments / Results  Labs (  all labs ordered are listed, but only abnormal results are displayed) Labs Reviewed  BASIC METABOLIC PANEL - Abnormal; Notable for the following:       Result Value   BUN 25 (*)    Creatinine, Ser 1.28 (*)    Calcium 8.6 (*)    GFR calc non Af Amer 49 (*)    GFR calc Af Amer 56 (*)    All other components within normal limits  CBC WITH DIFFERENTIAL/PLATELET - Abnormal; Notable for the following:    WBC 11.3 (*)    Neutro Abs 9.2 (*)    All other components within normal limits  I-STAT CHEM 8, ED - Abnormal; Notable for the following:    Potassium 6.2 (*)    BUN 40 (*)    Creatinine, Ser 1.30 (*)    Calcium, Ion 1.10 (*)    All other components within normal limits  URINALYSIS, ROUTINE W REFLEX MICROSCOPIC    EKG  EKG Interpretation  Date/Time:  Thursday May 02 2016 13:46:37 EDT Ventricular Rate:  62 PR Interval:    QRS Duration: 139 QT Interval:  444 QTC Calculation: 451 R Axis:   51 Text Interpretation:  Sinus rhythm Right bundle branch block Abnormal inferior Q waves No significant change was found Confirmed by CAMPOS  MD, KEVIN ([id number]) on 05/02/2016 3:15:07 PM       Radiology Dg Hand Complete Right  Result Date: 05/02/2016 CLINICAL DATA:  Fall today. Right index finger laceration. History of dementia. Initial encounter. EXAM: RIGHT HAND - COMPLETE 3+ VIEW COMPARISON:  None. FINDINGS: There is no evidence of acute fracture or dislocation. The bones appear mildly osteopenic. There may be mild soft tissue swelling involving the distal aspect of  the index finger. No radiopaque foreign body is seen. No destructive osseous lesion is identified. IMPRESSION: No acute osseous abnormality identified. Electronically Signed   By: Sebastian Ache M.D.   On: 05/02/2016 14:52    Procedures Procedures (including critical care time)  LACERATION REPAIR Performed by: Fayrene Helper Authorized by: Fayrene Helper Consent: Verbal consent obtained. Risks and benefits: risks, benefits and alternatives were discussed Consent given by: patient Patient identity confirmed: provided demographic data Prepped and Draped in normal sterile fashion Wound explored  Laceration Location: bridge of nose  Laceration Length: 2cm  No Foreign Bodies seen or palpated  Anesthesia: local infiltration  Local anesthetic: lidocaine 2% w/o epinephrine  Anesthetic total: 2 ml  Irrigation method: syringe Amount of cleaning: standard  Skin closure: prolene 6.0  Number of sutures: 4  Technique: simple interrupted  Patient tolerance: Patient tolerated the procedure well with no immediate complications.   Medications Ordered in ED Medications  lidocaine (XYLOCAINE) 2 % (with pres) injection 200 mg (200 mg Intradermal Given 05/02/16 1414)  acetaminophen (TYLENOL) tablet 650 mg (650 mg Oral Given 05/02/16 1413)     Initial Impression / Assessment and Plan / ED Course  I have reviewed the triage vital signs and the nursing notes.  Pertinent labs & imaging results that were available during my care of the patient were reviewed by me and considered in my medical decision making (see chart for details).     BP (!) 158/58 (BP Location: Right Arm)   Pulse 74   Temp 98.4 F (36.9 C) (Oral)   Resp 18   Ht 5\' 7"  (1.702 m)   Wt 82.6 kg   SpO2 96%   BMI 28.51 kg/m    Final Clinical Impressions(s) / ED Diagnoses   Final diagnoses:  Contusion of face, initial encounter  Contusion of brain without loss of consciousness, initial encounter (HCC)  Laceration of nose,  initial encounter  Dental injury, initial encounter  Minor injury of hand, right, initial encounter  Fall from standing, initial encounter    New Prescriptions New Prescriptions   ACETAMINOPHEN (TYLENOL) 500 MG TABLET    Take 1 tablet (500 mg total) by mouth every 6 (six) hours as needed for mild pain or moderate pain.   3:03 PM Elderly male had suspected mechanical fall injuring his face when he fell forward. On exam, he had a laceration to the bridge of his nose that was successfully repaired by me. He also has a loose left front central incisor but it has not falling off the socket and is not actively bleeding. Does have bruising to his upper lip. Also has tenderness to his right hand.  Head, cervical spine and maxillofacial CT was obtained which demonstrate a suspect small focal hemorrhagic contusion in the medial right frontal lobe without any concerning features. Fracture at the distal aspects of the nasal bone. This finding was discussed with family member and they felt comfortable monitoring patient at home and are aware of signs to bring patient back to the ER for further evaluation.  Labs are reassuring. Initially an elevated potassium of 6.2 with documented however after recheck, his potassium level is normal, suspect hemolysis causing an abnormal potassium level. Evidence of renal insufficiency with BUN 40, creatinine 1.3. Patient does have history of kidney disease in family members are aware.  Wound care instruction provided, Tylenol as needed for pain, patient will follow-up with his dentist for his dental injury. Recommend return in 3-5 days to PCP office for sutures removal. Return precaution discussed. Care discussed with Dr. Patria Mane.    Fayrene Helper, PA-C 05/02/16 1613    Azalia Bilis, MD 05/03/16 [phone]

## 2016-05-03 ENCOUNTER — Ambulatory Visit: Payer: Medicare Other | Admitting: Interventional Cardiology

## 2016-05-09 ENCOUNTER — Ambulatory Visit: Payer: Medicare Other | Admitting: Rheumatology

## 2016-05-20 ENCOUNTER — Telehealth: Payer: Self-pay | Admitting: Interventional Cardiology

## 2016-05-20 ENCOUNTER — Other Ambulatory Visit: Payer: Self-pay | Admitting: Interventional Cardiology

## 2016-05-20 MED ORDER — ISOSORBIDE MONONITRATE ER 30 MG PO TB24: 30.0000 mg | ORAL_TABLET | Freq: Every day | ORAL | 0 refills | Status: DC

## 2016-05-20 NOTE — Telephone Encounter (Signed)
New message     *STAT* If patient is at the pharmacy, call can be transferred to refill team.   1. Which medications need to be refilled? (please list name of each medication and dose if known) isosorbide mononitrate 30 mg   2. Which pharmacy/location (including street and city if local pharmacy) is medication to be sent to? [facility]  3. Do they need a 30 day or 90 day supply? 30 day

## 2016-05-23 ENCOUNTER — Ambulatory Visit: Payer: Medicare Other | Admitting: Rheumatology

## 2016-06-05 NOTE — Progress Notes (Signed)
Patient ID: David [name], male   DOB: 07/10/1926, 83 y.o.   MRN: [id number]     Cardiology Office Note   Date:  06/06/2016   ID:  David [name], DOB 1926-03-06, MRN [id number]  PCP:  David Housekeeper, MD    No chief complaint on file. abnormal nuclear stress test   Wt Readings from Last 3 Encounters:  06/06/16 173 lb 9.6 oz (78.7 kg)  05/02/16 182 lb (82.6 kg)  04/18/16 182 lb (82.6 kg)       History of Present Illness: David [name] is a 83 y.o. male  With dementia who had an abnormal stress test several years ago and anginal symptoms. Due to renal insufficiency, he has been managed medically. He has multiple risk factors for heart disease.  He has not been walking much. His wife states that he is just getting more sedentary. His dementia is also progressing. He has not used any nitroglycerin under his tongue. He tries to stay out of the cold weather. He does not complain much.   Incontinence has been there most troubling issue. They tried to back off his Lasix but then the lower extremity swelling worsened to the point he couldn't get his shoes on. He is back to taking Lasix regularly. Swelling is under control.  There is an aide comes to the house to help manage him. She helps elevate his legs and put on compression stockings.  With these measures, his swelling has been better. He does not walk much. He did suffer a fall. They're looking into getting a walker at this point. His weight is checked daily and has been stable. He has an intermittent cough at night which is managed with over-the-counter medications.    Past Medical History:  Diagnosis Date  . Alzheimer disease   . CKD (chronic kidney disease), stage III   . Colon polyp 2007  . Elevated PSA    4-7 in 2009  . Glaucoma   . Hernia, inguinal   . High cholesterol   . Hypercholesteremia   . Hyperlipemia   . Hypertension    grade 1 diastolic dysfunction, trace MR and TR, mild LVH Echo 3-/4  . Low back pain   .  Macular degeneration   . Memory deficit   . Prostate disease   . Urinary incontinence     Past Surgical History:  Procedure Laterality Date  . GALLBLADDER SURGERY  1996     Current Outpatient Prescriptions  Medication Sig Dispense Refill  . acetaminophen (TYLENOL) 500 MG tablet Take 1 tablet (500 mg total) by mouth every 6 (six) hours as needed for mild pain or moderate pain. 30 tablet 0  . amLODipine (NORVASC) 2.5 MG tablet Take 1 tablet by mouth in the evening  11  . ARICEPT 10 MG tablet TAKE 1 TABLET BY MOUTH EVERY DAY 90 tablet 2  . B Complex-C (SUPER B COMPLEX PO) Take 1 tablet by mouth daily.     . brimonidine (ALPHAGAN) 0.15 % ophthalmic solution 1 drop 3 (three) times daily. As directed    . Cholecalciferol (VITAMIN D-3) 1000 UNITS CAPS Take 1,000 Units by mouth daily.    . fish oil-omega-3 fatty acids 1000 MG capsule Take 1 g by mouth daily.     . folic acid (FOLVITE) 1 MG tablet Take 1 mg by mouth daily.    . isosorbide mononitrate (IMDUR) 30 MG 24 hr tablet Take 1 tablet (30 mg total) by mouth daily. 30 tablet 0  . NAMENDA 10 MG  tablet TAKE 1 TABLET BY MOUTH TWICE DAILY 60 tablet 11  . NITROSTAT 0.6 MG SL tablet Place 0.6 mg under the tongue every 5 (five) minutes as needed for chest pain.     Marland Kitchen omeprazole (PRILOSEC) 20 MG capsule Take 20 mg by mouth daily.    . QUEtiapine (SEROQUEL) 25 MG tablet Take 0.5 tablets (12.5 mg total) by mouth 2 (two) times daily. 60 tablet 0  . tamsulosin (FLOMAX) 0.4 MG CAPS capsule Take 0.8 mg by mouth every evening.     . furosemide (LASIX) 20 MG tablet Take 1 tablet (20 mg total) by mouth 2 (two) times daily. 60 tablet 11   No current facility-administered medications for this visit.     Allergies:   Coreg [carvedilol] and Norvasc [amlodipine besylate]    Social History:  The patient  reports that he has never smoked. He has never used smokeless tobacco. He reports that he does not drink alcohol or use drugs.   Family History:  The  patient's family history includes Cancer in his daughter; Dementia in his mother and sister; Stroke in his sister.    ROS:  Please see the history of present illness.   Otherwise, review of systems are positive for Lower extremity edema, progressive memory loss from Alzheimer's.   All other systems are reviewed and negative.    PHYSICAL EXAM: VS:  BP 116/74   Pulse 65   Ht  (1.702 m)   Wt 173 lb 9.6 oz (78.7 kg)   BMI 27.19 kg/m  , BMI Body mass index is 27.19 kg/m. GEN: Well nourished, well developed, in no acute distress  HEENT: normal  Neck: no JVD, carotid bruits, or masses Cardiac: RRR; no murmurs, rubs, or gallops, bilateral pitting lower extremity edema - mild, R>L chroncially Respiratory:  clear to auscultation bilaterally, normal work of breathing GI: soft, nontender, nondistended, + BS MS: no deformity or atrophy  Skin: warm and dry, no rash Neuro:  Strength and sensation are intact Psych: euthymic mood, full affect   Recent Labs: 05/02/2016: BUN 25; Creatinine, Ser 1.28; Hemoglobin 13.1; Platelets 213; Potassium 4.0; Sodium 140   Lipid Panel No results found for: CHOL, TRIG, HDL, CHOLHDL, VLDL, LDLCALC, LDLDIRECT   Other studies Reviewed: Additional studies/ records that were reviewed today with results demonstrating: Prior nuclear study results. Labs from 3/18 reviewed.  Cr stable.   ASSESSMENT AND PLAN:  1. CAD/abnormal stress test: he is not limited by symptoms at this point, partially because he is not very active. Thre is an aide that helps in the house and she elevated his legs and puts compression stockings.  He is more limited by his Alzheimer's disease- does not recognize family membres. Would not pursue any invasive testing at this time.  The wife feels that his angina is controlled. The patient is not complaining of anything. I would recommend for him to try to walk at least 150 minutes a week, in a safe environment.  He is not a good candidate for  invasive testing.  COuld increase Imdur if he had more sx.  He needs to avoid falling.  THey are getting a walker. 2. hyperlipidemia : Managed by Dr. Eula Listen. 3. hypertension: Blood pressure well controlled. 4. Chronic renal insufficiency: Managed by Dr. Eula Listen. 5. Edema/Chronic diastolic heart failure: Lasix helping. Edema still present but better than before.  Can use an extra 20 mg of furosemide if there is increased swelling or SHOB.   Current medicines are reviewed at  length with the patient today.  The patient concerns regarding his medicines were addressed.  The following changes have been made:  No change  Labs/ tests ordered today include:  No orders of the defined types were placed in this encounter.   Recommend 150 minutes/week of aerobic exercise, more important to avoid falling Low salt diet recommended  Disposition:   FU in one year   Signed, Lance Muss, MD  06/06/2016 11:32 AM    [facility] [address] [place], [place], [place]  [address] Phone: [phone]; Fax: [phone]

## 2016-06-06 ENCOUNTER — Ambulatory Visit (INDEPENDENT_AMBULATORY_CARE_PROVIDER_SITE_OTHER): Payer: Medicare Other | Admitting: Interventional Cardiology

## 2016-06-06 ENCOUNTER — Encounter: Payer: Self-pay | Admitting: Interventional Cardiology

## 2016-06-06 VITALS — BP 116/74 | HR 65 | Ht 67.0 in | Wt 173.6 lb

## 2016-06-06 DIAGNOSIS — I10 Essential (primary) hypertension: Secondary | ICD-10-CM | POA: Diagnosis not present

## 2016-06-06 DIAGNOSIS — R9430 Abnormal result of cardiovascular function study, unspecified: Secondary | ICD-10-CM

## 2016-06-06 DIAGNOSIS — N183 Chronic kidney disease, stage 3 unspecified: Secondary | ICD-10-CM

## 2016-06-06 DIAGNOSIS — G309 Alzheimer's disease, unspecified: Secondary | ICD-10-CM | POA: Diagnosis not present

## 2016-06-06 DIAGNOSIS — I5032 Chronic diastolic (congestive) heart failure: Secondary | ICD-10-CM

## 2016-06-06 DIAGNOSIS — R600 Localized edema: Secondary | ICD-10-CM | POA: Diagnosis not present

## 2016-06-06 DIAGNOSIS — F0280 Dementia in other diseases classified elsewhere without behavioral disturbance: Secondary | ICD-10-CM | POA: Diagnosis not present

## 2016-06-06 NOTE — Patient Instructions (Signed)
Medication Instructions:  Your physician recommends that you continue on your current medications as directed. Please refer to the Current Medication list given to you today.  You may take an extra 20 mg of furosemide (lasix) if you have increased swelling in your lower extremities, experience a 3 pound weight gain in a day or 5 pound weight gain in a week, or you if become short of breath.   Labwork: None ordered  Testing/Procedures: None ordered  Follow-Up: Your physician wants you to follow-up in: 1 year with Dr. Eldridge Dace. You will receive a reminder letter in the mail two months in advance. If you don't receive a letter, please call our office to schedule the follow-up appointment.   Any Other Special Instructions Will Be Listed Below (If Applicable).     If you need a refill on your cardiac medications before your next appointment, please call your pharmacy.

## 2016-06-11 ENCOUNTER — Ambulatory Visit (INDEPENDENT_AMBULATORY_CARE_PROVIDER_SITE_OTHER): Payer: Medicare Other | Admitting: Neurology

## 2016-06-11 ENCOUNTER — Encounter: Payer: Self-pay | Admitting: Neurology

## 2016-06-11 VITALS — BP 140/78 | Temp 92.0°F | Wt 176.8 lb

## 2016-06-11 DIAGNOSIS — G301 Alzheimer's disease with late onset: Secondary | ICD-10-CM

## 2016-06-11 DIAGNOSIS — F02818 Dementia in other diseases classified elsewhere, unspecified severity, with other behavioral disturbance: Secondary | ICD-10-CM

## 2016-06-11 DIAGNOSIS — F0281 Dementia in other diseases classified elsewhere with behavioral disturbance: Secondary | ICD-10-CM

## 2016-06-11 NOTE — Patient Instructions (Signed)
I had a long discussion with the patient and his caregiver with regards to his Alzheimer's which is now at an advanced stage. Continue Aricept 10 mg daily and Namenda 10 mg twice daily in the current dosages as he has not been able to tolerate higher doses in the past. Continue Seroquel 12.5 mg twice daily for his agitation as it seems to be working well but he still has some excessive daytime sleepiness and hence will not increase dose further..   Unfortunately there are no specific medications which will help the progression of Alzheimer's at this stage. The family continues to do a fantastic job of providing care for him at home Return for follow-up in one year or call earlier if necessary. Greater than 50% time during this 25 minute visit was spent on counseling and coordination of care about his advance Alzheimer's

## 2016-06-11 NOTE — Progress Notes (Signed)
PATIENT: David [name] DOB: 07/21/1926   REASON FOR VISIT: follow up for Alzheimer`s dementia HISTORY FROM: daughter and wife  HISTORY OF PRESENT ILLNESS: 83 year old patient with mild dementia x2 years etiology unclear-senile dementia versus early Alzheimer's. He appears clinically stable on objective testing the family feels is slightly worse.  01/15/12 (PS):  He returns for followup after last visit on 10/17/2010. Patient's son note they have noted progressive cognitive decline over the last one year. At last visit I had suggested increasing Aricept to 23 mg which he could not tolerate and reduced the dose back to 10 mg within a week. He is now having difficulty with completing sentences and word finding and at times forgets names of family members. He also has good and bad days. He at times is confused and disoriented. He has not had any significant difficulties with walking or falls. He remains on Aricept 10 mg daily as well as Namenda twice a day and fish oil. He keeps himself busy and mentally challenging tasks across her puzzles, [employer] and even [email or web address] but he has been needing more supervision. He is complaining of mild headache today and states he is having a bad day it did not do well on the Mini-Mental testing and scored only 13/30 with my medical assistant. After I administered the test prostrating the questions to him he is slightly better and scored 15/30 but clearly stated that he knew the answers but could not come up with him today.  UPDATE 03/24/13 (LL): Patient returns for a yearly followup visit his last visit was 01/15/2012.  He is accompanied by his daughter-in-law today.  His memory appears stable, MMSE score today is 18/30.  He has significant difficulty completing sentences and finding words that he wants to say.  He is in good mood and daughter-in-law states that he normally is. Patient has no complaints today.  He is tolerating Aricept 10 mg daily and Namenda 10 mg  twice a day well with no known side effects.  UPDATE 12/30/2013 (PS) :he returns for follow-up accompanied by his son. He seems to be doing about the same. He had a recent trip to [place] with his wife for a month to spend time with his daughters. Continues to have memory and cognitive difficulties which are essentially unchanged. He has occasional time finding words and completing sentences. Is usually in a good mood. He is tolerating Aricept 10 mg daily but for some reason is on Namenda 10 mg once a day only. He is having no GI or CNS side effects. He has had trouble tolerating the higher dose of Aricept and Namenda XR in the past.18/30 which is unchanged from last visit  Update 04/20/2014 : He returns for follow-up today accompanied by his wife and son-in-law after last visit 3 months ago. The family is concerned about increased agitation, irritability, confusion and disorientation. He was previously going to adult enrichment Center for 3-4 hours twice a week but recently he has been reluctant to do so and wants to go home after only a couple of hours. The wife states that she is at times finding it difficult to redirect him and tell him. He tends to reminiscent about his past and at times talks about his parents who were there been long dead. There are no concerns about violence, fall risk, gait or balance problems. He remains on Aricept 10 mg daily and Namenda 10 mg twice daily. He has not been able to tolerate higher doses of both medications  in the past. Patient has previously not been diagnosed with depression but on today's testing he scored 8 on the geriatric depression scale. His Mini-Mental status score today is 17/30 which is stable from last visit. He has had no other new health problems since her last visit.  Update 08/31/14: Patient returns for follow-up today accompanied by his daughter. The family continued have concerns about increased agitation, irritability, confusion and disorientation.  He was prescribed with depakote at last visit. However, according to Dr. Zannie Cove note, pt never took it as "it is too strong". He was prescribed with seroquel and family gives him 12.5mg  every night. He seems responding to medication well and the agitation issues seems to improved. He normally has sun downing in the evenings, and seroquel helps. However, recently, he has 2-3 episodes of agitation, confusion in the afternoon. For example, he closes all the curtains, shut the door in the afternoon, saying he has to go attend meeting and people is waiting for him, which is unusual for him that early during the day. Family concerned about the progression and asked earlier follow up in clinic. Had MMSE testing in clinic which is 8/30. However, in clinic today, pt is calm and polite, and cooperative.  Update 12/15/2014 : He returns for follow-up today after last visit with Dr. Roda Shutters 3 months ago. He was having issues with agitation during the day at that visit and that seems to have improved after low dose Seroquel 12.5 mg was used as needed during the daytime. He continues to take 25 mg at night which seems to help. His been having some intermittent issues with incontinence particularly at night. He has been taking Lasix 20 mg twice daily for leg swelling. He does go to [facility] twice a week but usually gets tired than a few hours and call us to be brought home. He does have a caregiver who comes for a few hours. Patient's family continues to take excellent care of him at home. He has not been able to tolerate higher doses of Aricept and Namenda in the past but remains on Aricept 10 mg daily and Namenda 10 g twice daily which is tolerating well. He has had no major falls but does need little help and direction with getting up and walking. There have been no delusions or hallucinations and wandering or unsafe behavior. She does continue to get confused and have poor short-term memory and attention. The family feels gradually they  have seen a decline over the last 1 year. Update 06/15/2015 : He returns for follow-up after last visit 6 months ago. Is accompanied by his wife and daughter. The patient continues to require 24-hour care. His behavior seems reasonably well controlled on the current dose of Seroquel 12.5 twice daily but he still is sleepy during the day. He is able to sleep most nights quite well. He continues to have bladder issues particularly in the evening when he cannot reach the restroom or call. He does wear a diaper but during the daytime he can use a urinal. There have been no issues with agitation, unsafe behavior, delusions, hallucinations. He remains on Aricept 10 mg daily and Namenda 10 mg twice daily and is tolerating both medications well. He did spend a few months in [place] with his daughter and the trip went well without major incident. The family continues to do a great job in and provide 24-hour supervision and care at home. They did discuss with his cardiologist whether to stop Lasix but decided  to continue it due to leg swelling Update 06/11/2016 : He returns for f/u after last visit 1 year ago. He is accompanied by his wife and caregiver. He  continues to require 24-hour care. His behavior seems reasonably well controlled on the current dose of Seroquel 12.5 twice daily but he still is sleepy during the day. He is able to sleep most nights quite well.He does wear a diaper but during the daytime he can use a urinal. There have been no issues with agitation, unsafe behavior, delusions, hallucinations. He remains on Aricept 10 mg daily and Namenda 10 mg twice daily and is tolerating both medications well.He now has a hospital bed. He does not wander. He gets confused but can be redirected easily but needs constant supervision.He has been having some gait and balance difficulties and needs to be watched and directed. He had a fall last month and was seen in ED for facial laceration needing stitches. REVIEW OF  SYSTEMS: Full 14 system review of systems performed and notable only for:  Shortness of breath, leg swelling,runny nose, incontinence of bladder, cough memory loss,  , daytime sleepiness,memory loss,joint pain, walking difficulty,snoringand all other systems negative ALLERGIES: Allergies  Allergen Reactions  . Coreg [Carvedilol]     bradycardia  . Norvasc [Amlodipine Besylate]     swelling    HOME MEDICATIONS: Outpatient Medications Prior to Visit  Medication Sig Dispense Refill  . acetaminophen (TYLENOL) 500 MG tablet Take 1 tablet (500 mg total) by mouth every 6 (six) hours as needed for mild pain or moderate pain. 30 tablet 0  . amLODipine (NORVASC) 2.5 MG tablet Take 1 tablet by mouth in the evening  11  . ARICEPT 10 MG tablet TAKE 1 TABLET BY MOUTH EVERY DAY 90 tablet 2  . B Complex-C (SUPER B COMPLEX PO) Take 1 tablet by mouth daily.     . brimonidine (ALPHAGAN) 0.15 % ophthalmic solution 1 drop 3 (three) times daily. As directed    . Cholecalciferol (VITAMIN D-3) 1000 UNITS CAPS Take 1,000 Units by mouth daily.    . fish oil-omega-3 fatty acids 1000 MG capsule Take 1 g by mouth daily.     . folic acid (FOLVITE) 1 MG tablet Take 1 mg by mouth daily.    . furosemide (LASIX) 20 MG tablet Take 1 tablet (20 mg total) by mouth 2 (two) times daily. 60 tablet 11  . isosorbide mononitrate (IMDUR) 30 MG 24 hr tablet Take 1 tablet (30 mg total) by mouth daily. 30 tablet 0  . NAMENDA 10 MG tablet TAKE 1 TABLET BY MOUTH TWICE DAILY 60 tablet 11  . NITROSTAT 0.6 MG SL tablet Place 0.6 mg under the tongue every 5 (five) minutes as needed for chest pain.     Marland Kitchen omeprazole (PRILOSEC) 20 MG capsule Take 20 mg by mouth daily.    . QUEtiapine (SEROQUEL) 25 MG tablet Take 0.5 tablets (12.5 mg total) by mouth 2 (two) times daily. 60 tablet 0  . tamsulosin (FLOMAX) 0.4 MG CAPS capsule Take 0.8 mg by mouth every evening.      No facility-administered medications prior to visit.     PAST MEDICAL  HISTORY: Past Medical History:  Diagnosis Date  . Alzheimer disease   . CKD (chronic kidney disease), stage III   . Colon polyp 2007  . Elevated PSA    4-7 in 2009  . Glaucoma   . Hernia, inguinal   . High cholesterol   . Hypercholesteremia   .  Hyperlipemia   . Hypertension    grade 1 diastolic dysfunction, trace MR and TR, mild LVH Echo 3-/4  . Low back pain   . Macular degeneration   . Memory deficit   . Prostate disease   . Urinary incontinence     PAST SURGICAL HISTORY: Past Surgical History:  Procedure Laterality Date  . GALLBLADDER SURGERY  1996    FAMILY HISTORY: Family History  Problem Relation Age of Onset  . Dementia Mother   . Cancer Daughter   . Stroke Sister     X37  . Dementia Sister   . Heart attack Neg Hx   . Hypertension Neg Hx     SOCIAL HISTORY: Social History   Social History  . Marital status: Married    Spouse name: Myna Hidalgo  . Number of children: 4  . Years of education: College   Occupational History  . retired    Social History Main Topics  . Smoking status: Never Smoker  . Smokeless tobacco: Never Used  . Alcohol use No  . Drug use: No  . Sexual activity: Not on file   Other Topics Concern  . Not on file   Social History Narrative   Patient is married with 4 children.   Patient is right handed.   Patient has college education.   Caffeine Use: 3 cups daily      PHYSICAL EXAM  Vitals:   06/11/16 1108  BP: 140/78  Temp: (!) 92 F (33.3 C)  Weight: 176 lb 12.8 oz (80.2 kg)   Body mass index is 27.69 kg/m.  Generalized: pleasant elderly [place] male, in no acute distress, pleasant elderly [place] male. Head: normocephalic and atraumatic.   Neck: Supple, no carotid bruits  Cardiac: Regular rate rhythm, no murmur  Musculoskeletal: small sebaceous cyst on the forehead in midline  Neurological examination  Mentation: drowsy but arouses easily. oriented to  Person only. Speech is minimal but can  speak few sentences in his native language. Diminished attention, registration and recall. Follows one-step commands, has difficulty with some two-step commands speech is clear but nonfluent. Palmomental reflexes present. Snout absent.  mini-mental status exam not done    Cranial nerve II-XII:  Pupils were equal round reactive to light extraocular movements were full, visual field were full on confrontational test. Facial sensation and strength were normal. hearing was intact to finger rubbing bilaterally. Uvula tongue midline. head turning and shoulder shrug and were normal and symmetric.Tongue protrusion into cheek strength was normal. Motor: normal bulk and tone, full strength in the BUE, BLE, fine finger movements normal, no pronator drift. No focal weakness Sensory: normal and symmetric to light touch Coordination: finger-nose-finger, heel-to-shin bilaterally, no dysmetria Reflexes:  Deep tendon reflexes in the upper and lower extremities are present and symmetric.  Gait and Station: gait iniation mild apraxia and some diffculty in arising from chair but broad-based gait with leaning to left and unable to do tandem walking; no festination; minimal apraxia while turning.   ASSESSMENT AND PLAN 83 y.o. year old male  has a past medical history of Hyperlipemia; High cholesterol; Prostate disease; Macular degeneration; and Glaucoma here with progressive dementia times 3-4 years etiology now likely advanced Alzheimer's   PLAN:  I had a long discussion with the patient and his caregiver with regards to his Alzheimer's which is now at an advanced stage. Continue Aricept 10 mg daily and Namenda 10 mg twice daily in the current dosages as he has not  been able to tolerate higher doses in the past. Continue Seroquel 12.5 mg twice daily for his agitation as it seems to be working well but he still has some excessive daytime sleepiness and hence will not increase dose further..   Unfortunately there are no  specific medications which will help the progression of Alzheimer's at this stage. The family continues to do a fantastic job of providing care for him at home Return for follow-up in one year or call earlier if necessary. Greater than 50% time during this 25 minute visit was spent on counseling and coordination of care about his advance Alzheimer's. Greater than 50% time during this 25 minute visit was spent on counseling and coordination of care about his advance Alzheimer's                                               Return in about 1 year (around 06/11/2017).  Delia Heady, MD Stroke Neurology 06/11/2016 7:50 PM

## 2016-06-14 ENCOUNTER — Other Ambulatory Visit: Payer: Self-pay | Admitting: Interventional Cardiology

## 2016-07-05 NOTE — Progress Notes (Signed)
Office Visit Note  Patient: David [name]             Date of Birth: 1926-08-25           MRN: [id number]             PCP: Wenda Low, MD Referring: Wenda Low, MD Visit Date: 07/18/2016 Occupation: @GUAROCC @    Subjective:  Pain bilateral knee joints according to his wife   History of Present Illness: David [name] is a 83 y.o. male with history of osteoarthritis and Alzheimer's. He is accompanied by his wife today. She states that after his knee joint injections he does well for about 3 months and then he started having discomfort and difficulty getting up from chair. She understands the side effects and even the whole family understands side effects and they want him to get cortisone injections as follows with his quality of life.  Activities of Daily Living:  Patient reports morning stiffness for 1 hour.   Patient Denies nocturnal pain.  Difficulty dressing/grooming: Denies Difficulty climbing stairs: Reports Difficulty getting out of chair: Reports Difficulty using hands for taps, buttons, cutlery, and/or writing: Denies   Review of Systems  Constitutional: Negative for fatigue, night sweats and weakness ( ).  HENT: Negative for mouth sores, mouth dryness and nose dryness.   Eyes: Negative for redness and dryness.  Respiratory: Negative for shortness of breath and difficulty breathing.   Cardiovascular: Negative for chest pain, palpitations, hypertension, irregular heartbeat and swelling in legs/feet.  Gastrointestinal: Negative for constipation and diarrhea.  Endocrine: Negative for increased urination.  Musculoskeletal: Positive for morning stiffness. Negative for arthralgias, joint pain, joint swelling, myalgias, muscle weakness, muscle tenderness and myalgias.  Skin: Negative for color change, rash, hair loss, nodules/bumps, skin tightness, ulcers and sensitivity to sunlight.  Allergic/Immunologic: Negative for susceptible to infections.  Neurological:  Negative for dizziness, fainting, memory loss and night sweats.  Hematological: Negative for swollen glands.  Psychiatric/Behavioral: Negative for depressed mood and sleep disturbance. The patient is not nervous/anxious.     PMFS History:  Patient Active Problem List   Diagnosis Date Noted  . Dyslipidemia 07/16/2016  . History of glaucoma 07/16/2016  . Bilateral lower extremity edema 06/06/2016  . Chronic pain of both knees 04/17/2016  . Chronic left shoulder pain 04/17/2016  . CRI (chronic renal insufficiency) 05/03/2015  . Alzheimer's disease 08/31/2014  . Agitation 08/31/2014  . Dementia 08/31/2014  . Onychomycosis 01/28/2014  . Dementia in Alzheimer's disease 03/24/2013  . Nonspecific abnormal results of cardiovascular function study 03/02/2013  . Shortness of breath 03/02/2013  . Essential hypertension, benign 01/08/2013    Past Medical History:  Diagnosis Date  . Alzheimer disease   . CKD (chronic kidney disease), stage III   . Colon polyp 2007  . Elevated PSA    4-7 in 2009  . Glaucoma   . Hernia, inguinal   . High cholesterol   . Hypercholesteremia   . Hyperlipemia   . Hypertension    grade 1 diastolic dysfunction, trace MR and TR, mild LVH Echo 3-/4  . Low back pain   . Macular degeneration   . Memory deficit   . Prostate disease   . Urinary incontinence     Family History  Problem Relation Age of Onset  . Dementia Mother   . Cancer Daughter   . Stroke Sister        X21  . Dementia Sister   . Heart attack Neg Hx   . Hypertension  Neg Hx    Past Surgical History:  Procedure Laterality Date  . [place]   Social History   Social History Narrative   Patient is married with 4 children.   Patient is right handed.   Patient has college education.   Caffeine Use: 3 cups daily      Objective: Vital Signs: BP (!) 143/80   Pulse 95   Resp 14   Ht 5' 7"  (1.702 m)   Wt 173 lb (78.5 kg)   BMI 27.10 kg/m    Physical Exam    Constitutional: He is oriented to person, place, and time. He appears well-developed and well-nourished.  Patient is not very interactive due to Alzheimer's. Although he answers simple questions  HENT:  Head: Normocephalic and atraumatic.  Eyes: Conjunctivae and EOM are normal. Pupils are equal, round, and reactive to light.  Neck: Normal range of motion. Neck supple.  Cardiovascular: Normal rate, regular rhythm and normal heart sounds.   Pulmonary/Chest: Effort normal and breath sounds normal.  Abdominal: Soft. Bowel sounds are normal.  Neurological: He is alert and oriented to person, place, and time.  Skin: Skin is warm and dry. Capillary refill takes less than 2 seconds.  Psychiatric: He has a normal mood and affect. His behavior is normal.  Nursing note and vitals reviewed.    Musculoskeletal Exam: C-spine good range of motion thoracic spine-doses shoulder joints elbow joints wrist joint MCPs PIPs DIPs with good range of motion. Hip joints knee joints ankles MTPs PIPs DIPs with good range of motion. No warmth swelling or effusion was noted.  CDAI Exam: No CDAI exam completed.    Investigation: No additional findings.   No visits with results within 2 Month(s) from this visit.  Latest known visit with results is:  Admission on 05/02/2016, Discharged on 05/02/2016  Component Date Value Ref Range Status  . Sodium 05/02/2016 140  135 - 145 mmol/L Final  . Potassium 05/02/2016 6.2* 3.5 - 5.1 mmol/L Final  . Chloride 05/02/2016 102  101 - 111 mmol/L Final  . BUN 05/02/2016 40* 6 - 20 mg/dL Final  . Creatinine, Ser 05/02/2016 1.30* 0.61 - 1.24 mg/dL Final  . Glucose, Bld 05/02/2016 96  65 - 99 mg/dL Final  . Calcium, Ion 05/02/2016 1.10* 1.15 - 1.40 mmol/L Final  . TCO2 05/02/2016 34  0 - 100 mmol/L Final  . Hemoglobin 05/02/2016 13.3  13.0 - 17.0 g/dL Final  . HCT 05/02/2016 39.0  39.0 - 52.0 % Final  . Sodium 05/02/2016 140  135 - 145 mmol/L Final  . Potassium 05/02/2016 4.0   3.5 - 5.1 mmol/L Final  . Chloride 05/02/2016 103  101 - 111 mmol/L Final  . CO2 05/02/2016 29  22 - 32 mmol/L Final  . Glucose, Bld 05/02/2016 99  65 - 99 mg/dL Final  . BUN 05/02/2016 25* 6 - 20 mg/dL Final  . Creatinine, Ser 05/02/2016 1.28* 0.61 - 1.24 mg/dL Final  . Calcium 05/02/2016 8.6* 8.9 - 10.3 mg/dL Final  . GFR calc non Af Amer 05/02/2016 49* >60 mL/min Final  . GFR calc Af Amer 05/02/2016 56* >60 mL/min Final   Comment: (NOTE) The eGFR has been calculated using the CKD EPI equation. This calculation has not been validated in all clinical situations. eGFR's persistently <60 mL/min signify possible Chronic Kidney Disease.   . Anion gap 05/02/2016 8  5 - 15 Final  . WBC 05/02/2016 11.3* 4.0 - 10.5 K/uL Final  . RBC  05/02/2016 4.58  4.22 - 5.81 MIL/uL Final  . Hemoglobin 05/02/2016 13.1  13.0 - 17.0 g/dL Final  . HCT 05/02/2016 40.5  39.0 - 52.0 % Final  . MCV 05/02/2016 88.4  78.0 - 100.0 fL Final  . MCH 05/02/2016 28.6  26.0 - 34.0 pg Final  . MCHC 05/02/2016 32.3  30.0 - 36.0 g/dL Final  . RDW 05/02/2016 14.8  11.5 - 15.5 % Final  . Platelets 05/02/2016 213  150 - 400 K/uL Final  . Neutrophils Relative % 05/02/2016 81  % Final  . Neutro Abs 05/02/2016 9.2* 1.7 - 7.7 K/uL Final  . Lymphocytes Relative 05/02/2016 8  % Final  . Lymphs Abs 05/02/2016 0.9  0.7 - 4.0 K/uL Final  . Monocytes Relative 05/02/2016 9  % Final  . Monocytes Absolute 05/02/2016 1.0  0.1 - 1.0 K/uL Final  . Eosinophils Relative 05/02/2016 2  % Final  . Eosinophils Absolute 05/02/2016 0.2  0.0 - 0.7 K/uL Final  . Basophils Relative 05/02/2016 0  % Final  . Basophils Absolute 05/02/2016 0.0  0.0 - 0.1 K/uL Final     Imaging: No results found.  Speciality Comments: No specialty comments available.    Procedures:  Large Joint Inj Date/Time: 07/18/2016 11:20 AM Performed by: Bo Merino Authorized by: Bo Merino   Consent Given by:  Patient Site marked: the procedure site was  marked   Timeout: prior to procedure the correct patient, procedure, and site was verified   Indications:  Pain and joint swelling Location:  Knee Site:  R knee Prep: patient was prepped and draped in usual sterile fashion   Needle Size:  27 G Needle Length:  1.5 inches Approach:  Medial Ultrasound Guidance: No   Fluoroscopic Guidance: No   Arthrogram: No   Medications:  1.5 mL lidocaine 1 %; 40 mg triamcinolone acetonide 40 MG/ML Aspiration Attempted: Yes   Patient tolerance:  Patient tolerated the procedure well with no immediate complications Large Joint Inj Date/Time: 07/18/2016 11:20 AM Performed by: Bo Merino Authorized by: Bo Merino   Consent Given by:  Patient Site marked: the procedure site was marked   Timeout: prior to procedure the correct patient, procedure, and site was verified   Indications:  Pain and joint swelling Location:  Knee Site:  L knee Prep: patient was prepped and draped in usual sterile fashion   Needle Size:  27 G Needle Length:  1.5 inches Approach:  Medial Ultrasound Guidance: No   Fluoroscopic Guidance: No   Arthrogram: No   Medications:  1.5 mL lidocaine 1 %; 40 mg triamcinolone acetonide 40 MG/ML Aspiration Attempted: Yes   Aspirate amount (mL):  0 Patient tolerance:  Patient tolerated the procedure well with no immediate complications   Allergies: Coreg [carvedilol] and Norvasc [amlodipine besylate]   Assessment / Plan:     Visit Diagnoses: Primary osteoarthritis of both knees: He's been having increased pain and stiffness in his bilateral knee joints. Per request of his wife we decided to proceed with bilateral knee joint injections. After informed consent was obtained bilaterally joins her potential fashion injected with cortisone which she tolerated well. Procedures described above.  Chronic pain of both knees  Adhesive capsulitis of left shoulder: Not very symptomatic currently  Chronic renal impairment, stage 3  (moderate)  Bilateral lower extremity edema  Dementia in Alzheimer's disease  Essential hypertension, benign: Advised to monitor blood pressure closely  Dyslipidemia  History of glaucoma    Orders: Orders Placed This Encounter  Procedures  . Large Joint Injection/Arthrocentesis  . Large Joint Injection/Arthrocentesis  . Large Joint Injection/Arthrocentesis   No orders of the defined types were placed in this encounter.   Face-to-face time spent with patient was 25 minutes. 50% of time was spent in counseling and coordination of care.  Follow-Up Instructions: Return in about 3 months (around 10/18/2016) for Osteoarthritis.   Bo Merino, MD  Note - This record has been created using [employer].  Chart creation errors have been sought, but may not always  have been located. Such creation errors do not reflect on  the standard of medical care.

## 2016-07-09 ENCOUNTER — Other Ambulatory Visit: Payer: Self-pay | Admitting: Neurology

## 2016-07-10 ENCOUNTER — Other Ambulatory Visit: Payer: Self-pay

## 2016-07-10 MED ORDER — MEMANTINE HCL 10 MG PO TABS: 10.0000 mg | ORAL_TABLET | Freq: Two times a day (BID) | ORAL | 11 refills | Status: DC

## 2016-07-16 DIAGNOSIS — Z8669 Personal history of other diseases of the nervous system and sense organs: Secondary | ICD-10-CM | POA: Insufficient documentation

## 2016-07-16 DIAGNOSIS — E785 Hyperlipidemia, unspecified: Secondary | ICD-10-CM | POA: Insufficient documentation

## 2016-07-18 ENCOUNTER — Ambulatory Visit (INDEPENDENT_AMBULATORY_CARE_PROVIDER_SITE_OTHER): Payer: Medicare Other | Admitting: Rheumatology

## 2016-07-18 ENCOUNTER — Encounter: Payer: Self-pay | Admitting: Rheumatology

## 2016-07-18 VITALS — BP 143/80 | HR 95 | Resp 14 | Ht 67.0 in | Wt 173.0 lb

## 2016-07-18 DIAGNOSIS — M170 Bilateral primary osteoarthritis of knee: Secondary | ICD-10-CM

## 2016-07-18 DIAGNOSIS — I10 Essential (primary) hypertension: Secondary | ICD-10-CM

## 2016-07-18 DIAGNOSIS — N183 Chronic kidney disease, stage 3 unspecified: Secondary | ICD-10-CM

## 2016-07-18 DIAGNOSIS — M7502 Adhesive capsulitis of left shoulder: Secondary | ICD-10-CM

## 2016-07-18 DIAGNOSIS — R600 Localized edema: Secondary | ICD-10-CM

## 2016-07-18 DIAGNOSIS — G309 Alzheimer's disease, unspecified: Secondary | ICD-10-CM

## 2016-07-18 DIAGNOSIS — F0280 Dementia in other diseases classified elsewhere without behavioral disturbance: Secondary | ICD-10-CM | POA: Diagnosis not present

## 2016-07-18 DIAGNOSIS — E785 Hyperlipidemia, unspecified: Secondary | ICD-10-CM

## 2016-07-18 DIAGNOSIS — M25562 Pain in left knee: Secondary | ICD-10-CM | POA: Diagnosis not present

## 2016-07-18 DIAGNOSIS — G8929 Other chronic pain: Secondary | ICD-10-CM

## 2016-07-18 DIAGNOSIS — Z8669 Personal history of other diseases of the nervous system and sense organs: Secondary | ICD-10-CM | POA: Diagnosis not present

## 2016-07-18 DIAGNOSIS — M25561 Pain in right knee: Secondary | ICD-10-CM | POA: Diagnosis not present

## 2016-07-18 MED ORDER — LIDOCAINE HCL 1 % IJ SOLN
1.5000 mL | INTRAMUSCULAR | Status: AC | PRN
  Administered 2016-07-18: 1.5 mL

## 2016-07-18 MED ORDER — TRIAMCINOLONE ACETONIDE 40 MG/ML IJ SUSP
40.0000 mg | INTRAMUSCULAR | Status: AC | PRN
  Administered 2016-07-18: 40 mg via INTRA_ARTICULAR

## 2016-07-23 ENCOUNTER — Other Ambulatory Visit: Payer: Self-pay | Admitting: Neurology

## 2016-10-15 DIAGNOSIS — M170 Bilateral primary osteoarthritis of knee: Secondary | ICD-10-CM | POA: Insufficient documentation

## 2016-10-15 NOTE — Progress Notes (Deleted)
Office Visit Note  Patient: David [name]             Date of Birth: 1927-02-17           MRN: [id number]             PCP: Georgann Housekeeper, MD Referring: Georgann Housekeeper, MD Visit Date: 10/18/2016 Occupation: @GUAROCC @    Subjective:  No chief complaint on file.   History of Present Illness: David [name] is a 83 y.o. male ***   Activities of Daily Living:  Patient reports morning stiffness for *** {minute/hour:19697}.   Patient {ACTIONS;DENIES/REPORTS:21021675::"Denies"} nocturnal pain.  Difficulty dressing/grooming: {ACTIONS;DENIES/REPORTS:21021675::"Denies"} Difficulty climbing stairs: {ACTIONS;DENIES/REPORTS:21021675::"Denies"} Difficulty getting out of chair: {ACTIONS;DENIES/REPORTS:21021675::"Denies"} Difficulty using hands for taps, buttons, cutlery, and/or writing: {ACTIONS;DENIES/REPORTS:21021675::"Denies"}   No Rheumatology ROS completed.   PMFS History:  Patient Active Problem List   Diagnosis Date Noted  . Primary osteoarthritis of both knees 10/15/2016  . Dyslipidemia 07/16/2016  . History of glaucoma 07/16/2016  . Bilateral lower extremity edema 06/06/2016  . Chronic pain of both knees 04/17/2016  . Chronic left shoulder pain 04/17/2016  . CRI (chronic renal insufficiency) 05/03/2015  . Alzheimer's disease 08/31/2014  . Agitation 08/31/2014  . Dementia 08/31/2014  . Onychomycosis 01/28/2014  . Dementia in Alzheimer's disease 03/24/2013  . Nonspecific abnormal results of cardiovascular function study 03/02/2013  . Shortness of breath 03/02/2013  . Essential hypertension, benign 01/08/2013    Past Medical History:  Diagnosis Date  . Alzheimer disease   . CKD (chronic kidney disease), stage III   . Colon polyp 2007  . Elevated PSA    4-7 in 2009  . Glaucoma   . Hernia, inguinal   . High cholesterol   . Hypercholesteremia   . Hyperlipemia   . Hypertension    grade 1 diastolic dysfunction, trace MR and TR, mild LVH Echo 3-/4  . Low back pain     . Macular degeneration   . Memory deficit   . Prostate disease   . Urinary incontinence     Family History  Problem Relation Age of Onset  . Dementia Mother   . Cancer Daughter   . Stroke Sister        X84  . Dementia Sister   . Heart attack Neg Hx   . Hypertension Neg Hx    Past Surgical History:  Procedure Laterality Date  . GALLBLADDER SURGERY  1996   Social History   Social History Narrative   Patient is married with 4 children.   Patient is right handed.   Patient has college education.   Caffeine Use: 3 cups daily      Objective: Vital Signs: There were no vitals taken for this visit.   Physical Exam   Musculoskeletal Exam: ***  CDAI Exam: No CDAI exam completed.    Investigation: No additional findings. CBC Latest Ref Rng & Units 05/02/2016 05/02/2016 04/10/2013  WBC 4.0 - 10.5 K/uL 11.3(H) - 11.6(H)  Hemoglobin 13.0 - 17.0 g/dL 40.9 81.1 91.4  Hematocrit 39.0 - 52.0 % 40.5 39.0 42.0  Platelets 150 - 400 K/uL 213 - 215   CMP Latest Ref Rng & Units 05/02/2016 05/02/2016 11/25/2013  Glucose 65 - 99 mg/dL 99 96 81  BUN 6 - 20 mg/dL 78(G) 95(A) 21(H)  Creatinine 0.61 - 1.24 mg/dL 0.86(V) 7.84(O) 1.5  Sodium 135 - 145 mmol/L 140 140 139  Potassium 3.5 - 5.1 mmol/L 4.0 6.2(H) 3.6  Chloride 101 - 111 mmol/L 103 102 102  CO2 22 - 32 mmol/L 29 - 33(H)  Calcium 8.9 - 10.3 mg/dL 4.5(T) - 8.7  Total Protein 6.0 - 8.3 g/dL - - -  Total Bilirubin 0.3 - 1.2 mg/dL - - -  Alkaline Phos 39 - 117 U/L - - -  AST 0 - 37 U/L - - -  ALT 0 - 53 U/L - - -     Imaging: No results found.  Speciality Comments: No specialty comments available.    Procedures:  No procedures performed Allergies: Coreg [carvedilol] and Norvasc [amlodipine besylate]   Assessment / Plan:     Visit Diagnoses: Primary osteoarthritis of both knees  History of dementia  Chronic pain of both knees  History of renal insufficiency    Orders: No orders of the defined types were placed  in this encounter.  No orders of the defined types were placed in this encounter.   Face-to-face time spent with patient was *** minutes. 50% of time was spent in counseling and coordination of care.  Follow-Up Instructions: No Follow-up on file.   Amy Littrell, RT  Note - This record has been created using [employer].  Chart creation errors have been sought, but may not always  have been located. Such creation errors do not reflect on  the standard of medical care.

## 2016-10-18 ENCOUNTER — Ambulatory Visit: Payer: Medicare Other | Admitting: Rheumatology

## 2016-11-16 ENCOUNTER — Ambulatory Visit (HOSPITAL_BASED_OUTPATIENT_CLINIC_OR_DEPARTMENT_OTHER)
Admission: RE | Admit: 2016-11-16 | Discharge: 2016-11-16 | Disposition: A | Discharge status: Home / Self Care | Payer: Medicare Other | Source: Ambulatory Visit | Attending: Geriatric Medicine | Admitting: Geriatric Medicine

## 2016-11-16 ENCOUNTER — Other Ambulatory Visit (HOSPITAL_BASED_OUTPATIENT_CLINIC_OR_DEPARTMENT_OTHER): Payer: Self-pay | Admitting: Geriatric Medicine

## 2016-11-16 DIAGNOSIS — R600 Localized edema: Secondary | ICD-10-CM

## 2016-11-16 DIAGNOSIS — I82413 Acute embolism and thrombosis of femoral vein, bilateral: Secondary | ICD-10-CM | POA: Insufficient documentation

## 2017-02-03 ENCOUNTER — Ambulatory Visit (INDEPENDENT_AMBULATORY_CARE_PROVIDER_SITE_OTHER): Payer: Medicare Other | Admitting: Rheumatology

## 2017-02-03 ENCOUNTER — Encounter: Payer: Self-pay | Admitting: Rheumatology

## 2017-02-03 VITALS — BP 129/68 | Resp 18 | Ht 66.0 in | Wt 179.0 lb

## 2017-02-03 DIAGNOSIS — G309 Alzheimer's disease, unspecified: Secondary | ICD-10-CM | POA: Diagnosis not present

## 2017-02-03 DIAGNOSIS — M25562 Pain in left knee: Secondary | ICD-10-CM

## 2017-02-03 DIAGNOSIS — N183 Chronic kidney disease, stage 3 unspecified: Secondary | ICD-10-CM

## 2017-02-03 DIAGNOSIS — M170 Bilateral primary osteoarthritis of knee: Secondary | ICD-10-CM

## 2017-02-03 DIAGNOSIS — M7502 Adhesive capsulitis of left shoulder: Secondary | ICD-10-CM

## 2017-02-03 DIAGNOSIS — F0280 Dementia in other diseases classified elsewhere without behavioral disturbance: Secondary | ICD-10-CM | POA: Diagnosis not present

## 2017-02-03 DIAGNOSIS — G8929 Other chronic pain: Secondary | ICD-10-CM

## 2017-02-03 DIAGNOSIS — Z8669 Personal history of other diseases of the nervous system and sense organs: Secondary | ICD-10-CM | POA: Diagnosis not present

## 2017-02-03 DIAGNOSIS — M25561 Pain in right knee: Secondary | ICD-10-CM

## 2017-02-03 DIAGNOSIS — E785 Hyperlipidemia, unspecified: Secondary | ICD-10-CM | POA: Diagnosis not present

## 2017-02-03 NOTE — Progress Notes (Signed)
Office Visit Note  Patient: David [name]             Date of Birth: 03-09-1926           MRN: [id number]             PCP: Georgann HousekeeperHusain, Karrar, MD Referring: Georgann HousekeeperHusain, Karrar, MD Visit Date: 02/03/2017 Occupation: @GUAROCC @    Subjective:  Bilateral knee pain    History of Present Illness: David [name] is a 83 y.o. male with history of osteoarthritis.  Patient presents with a walker.  He has bilateral knee pain and stiffness.  He denies any recent injury or falls.  He states previous knee injections helped significantly and he would like repeat cortisone injections.   Activities of Daily Living:  Patient reports morning stiffness for 2 hours.   Patient Denies nocturnal pain.  Difficulty dressing/grooming: Denies Difficulty climbing stairs: Reports Difficulty getting out of chair: Reports Difficulty using hands for taps, buttons, cutlery, and/or writing: Denies   Review of Systems  Constitutional: Positive for fatigue. Negative for night sweats and weakness.  HENT: Negative for mouth sores, mouth dryness and nose dryness.   Eyes: Negative for redness and dryness.  Respiratory: Negative for cough, hemoptysis, shortness of breath and difficulty breathing.   Cardiovascular: Negative for chest pain, palpitations, hypertension, irregular heartbeat and swelling in legs/feet.  Gastrointestinal: Negative for blood in stool, constipation and diarrhea.  Endocrine: Negative for increased urination.  Genitourinary: Negative for painful urination.  Musculoskeletal: Positive for arthralgias, joint pain, joint swelling, muscle weakness and morning stiffness. Negative for myalgias, muscle tenderness and myalgias.  Skin: Negative for color change, rash, hair loss, nodules/bumps, skin tightness, ulcers and sensitivity to sunlight.  Allergic/Immunologic: Negative for susceptible to infections.  Neurological: Negative for dizziness, fainting, memory loss and night sweats.  Hematological: Negative  for swollen glands.  Psychiatric/Behavioral: Negative for depressed mood and sleep disturbance. The patient is not nervous/anxious.     PMFS History:  Patient Active Problem List   Diagnosis Date Noted  . Primary osteoarthritis of both knees 10/15/2016  . Dyslipidemia 07/16/2016  . History of glaucoma 07/16/2016  . Bilateral lower extremity edema 06/06/2016  . Chronic pain of both knees 04/17/2016  . Chronic left shoulder pain 04/17/2016  . CRI (chronic renal insufficiency) 05/03/2015  . Alzheimer's disease 08/31/2014  . Agitation 08/31/2014  . Dementia 08/31/2014  . Onychomycosis 01/28/2014  . Dementia in Alzheimer's disease 03/24/2013  . Nonspecific abnormal results of cardiovascular function study 03/02/2013  . Shortness of breath 03/02/2013  . Essential hypertension, benign 01/08/2013    Past Medical History:  Diagnosis Date  . Alzheimer disease   . CKD (chronic kidney disease), stage III (HCC)   . Colon polyp 2007  . Elevated PSA    4-7 in 2009  . Glaucoma   . Hernia, inguinal   . High cholesterol   . Hypercholesteremia   . Hyperlipemia   . Hypertension    grade 1 diastolic dysfunction, trace MR and TR, mild LVH Echo 3-/4  . Low back pain   . Macular degeneration   . Memory deficit   . Prostate disease   . Urinary incontinence     Family History  Problem Relation Age of Onset  . Dementia Mother   . Cancer Daughter   . Stroke Sister        73X2  . Dementia Sister   . Heart attack Neg Hx   . Hypertension Neg Hx    Past Surgical History:  Procedure Laterality Date  . GALLBLADDER SURGERY  1996   Social History   Social History Narrative   Patient is married with 4 children.   Patient is right handed.   Patient has college education.   Caffeine Use: 3 cups daily      Objective: Vital Signs: BP 129/68 (BP Location: Left Arm, Patient Position: Sitting, Cuff Size: Normal)   Resp 18   Ht 5\' 6"  (1.676 m)   Wt 179 lb (81.2 kg)   BMI 28.89 kg/m     Physical Exam  Constitutional: He is oriented to person, place, and time. He appears well-developed and well-nourished.  HENT:  Head: Normocephalic and atraumatic.  Eyes: Conjunctivae and EOM are normal. Pupils are equal, round, and reactive to light.  Neck: Normal range of motion. Neck supple.  Cardiovascular: Normal rate, regular rhythm and normal heart sounds.  Pulmonary/Chest: Effort normal and breath sounds normal.  Abdominal: Soft. Bowel sounds are normal.  Neurological: He is alert and oriented to person, place, and time.  Skin: Skin is warm and dry. Capillary refill takes less than 2 seconds.  Psychiatric: He has a normal mood and affect. His behavior is normal.  Nursing note and vitals reviewed.    Musculoskeletal Exam: C-spine good ROM.  Thoracic and lumbar unable to assess because in wheelchair.  Shoulder joints, elbow joints, and wrist joints good ROM.  MCP, PIPs, and DIPs good ROM with no synovitis.  Bilateral knees limited ROM with no warmth or effusion.  Ankle joints good ROM.  MTPs, PIPs, and DIPs good ROM with no synovitis.    CDAI Exam: No CDAI exam completed.    Investigation: No additional findings.   Imaging: No results found.  Speciality Comments: No specialty comments available.    Procedures:  Large Joint Inj: bilateral knee on 02/03/2017 11:33 AM Indications: pain Details: 27 G 1.5 in needle, medial approach  Arthrogram: No  Medications (Right): 1.5 mL lidocaine 1 %; 40 mg triamcinolone acetonide 40 MG/ML Aspirate (Right): 0 mL Medications (Left): 1.5 mL lidocaine 1 %; 40 mg triamcinolone acetonide 40 MG/ML Aspirate (Left): 0 mL Outcome: tolerated well, no immediate complications Procedure, treatment alternatives, risks and benefits explained, specific risks discussed. Immediately prior to procedure a time out was called to verify the correct patient, procedure, equipment, support staff and site/side marked as required. Patient was prepped and  draped in the usual sterile fashion.     Allergies: Coreg [carvedilol] and Norvasc [amlodipine besylate]   Assessment / Plan:     Visit Diagnoses: Primary osteoarthritis of both knees: Patient is having bilateral knee pain, which requires him to use a walker/wheelchair.  No warmth or effusion.  The risks and benefits of a cortisone injections was discussed.  Bilateral cortisone knee injections were performed today in the office.  Tolerated procedure well.    Chronic pain of both knees: Bilateral cortisone injection performed today in the office.    Adhesive capsulitis of left shoulder: Continues to have stiffness and discomfort.  Limited ROM on exam.    Other medical conditions are listed as follows:   Chronic renal impairment, stage 3 (moderate) (HCC)  History of glaucoma  Dyslipidemia  Dementia in Alzheimer's disease    Orders: Orders Placed This Encounter  Procedures  . Large Joint Inj: bilateral knee   No orders of the defined types were placed in this encounter.   Follow-Up Instructions: Return in about 6 months (around 08/04/2017) for Osteoarthritis.   Note - This record has been  created using [employer].  Chart creation errors have been sought, but may not always  have been located. Such creation errors do not reflect on  the standard of medical care.

## 2017-02-04 MED ORDER — TRIAMCINOLONE ACETONIDE 40 MG/ML IJ SUSP
40.0000 mg | INTRAMUSCULAR | Status: AC | PRN
  Administered 2017-02-03: 40 mg via INTRA_ARTICULAR

## 2017-02-04 MED ORDER — LIDOCAINE HCL 1 % IJ SOLN
1.5000 mL | INTRAMUSCULAR | Status: AC | PRN
  Administered 2017-02-03: 1.5 mL

## 2017-02-28 ENCOUNTER — Other Ambulatory Visit: Payer: Self-pay | Admitting: Interventional Cardiology

## 2017-03-10 ENCOUNTER — Other Ambulatory Visit: Payer: Self-pay | Admitting: Internal Medicine

## 2017-03-10 DIAGNOSIS — R0989 Other specified symptoms and signs involving the circulatory and respiratory systems: Secondary | ICD-10-CM

## 2017-03-13 ENCOUNTER — Other Ambulatory Visit: Payer: Self-pay | Admitting: Internal Medicine

## 2017-03-13 ENCOUNTER — Ambulatory Visit
Admission: RE | Admit: 2017-03-13 | Discharge: 2017-03-13 | Disposition: A | Discharge status: Home / Self Care | Payer: Medicare Other | Source: Ambulatory Visit | Attending: Internal Medicine | Admitting: Internal Medicine

## 2017-03-13 DIAGNOSIS — R0989 Other specified symptoms and signs involving the circulatory and respiratory systems: Secondary | ICD-10-CM

## 2017-03-20 ENCOUNTER — Other Ambulatory Visit: Payer: Self-pay | Admitting: *Deleted

## 2017-03-20 ENCOUNTER — Other Ambulatory Visit: Payer: Self-pay | Admitting: Interventional Cardiology

## 2017-03-20 NOTE — Patient Outreach (Addendum)
[facility](THN) Care Management  03/20/2017  David [name] [birth year] [id number]   Telephone Screen  Referral Date:  03/11/2017 [facility]  [facility] Reason for Referral:  Family requesting continued support Insurance:  Medicare   Outreach Attempt:   Received telephone call back from patient's son, David [name] (ROI on file).  HIPAA confirmed with son.  Son completed telephone screening.  Social:  Patient lives in the home with son, son's family and his wife.  He is dependent with ADLs and IADLs, needing assistance with eating, bathing, and dressing.  Patient's children privately pays for home aides to assist with care of patient.  Family provides transportation to medical appointments.  Patient was very fluent in [place], but currently with his confusion he speaks mostly in his native language, Panjabi.  Continues to understand [place].  Wife or other family members are always available to translate.  Son reports multiple falls over the last few years, without injury.  Ambulates mostly with rolling walker.  DME in the home include: rolling walker, cane, wheelchair, scale, blood pressure cuff, adjustable bed, shower chair, and grab bars in the shower.  Conditions:   Per chart review and discussion with son, PMH include:  Recent DVT's on eliquis, congestive heart failure with lower extremity edema, osteoarthritis, dyslipidemia, Alzheimer's Dementia, glaucoma, and hypertension.  Patient has just completed home health physical therapy with [facility].  Has recently had an increase in lower extremity edema with blisters forming.  Family is requesting nursing to come and assess and monitor blood pressures and lower extremity edema.  Family is also interested in possibly using patient's medicaid benefits to assist in providing caregivers for patient, ie personal care services.  Medications:  Patient is taking about 5 medications per son.  Denies any difficulties affording  medications.  States wife manages medications with pill box and wife or other family dispenses medications to patient.  Appointments:  Patient attended appointment with Dr. Donette LarryHusain on 03/17/2017.  Advanced Directives:  Per son, patient has Health Care Power of Noble[place]orney, Living Will and Most form in place.   Cons[facility] and discussed with [facility].  Son verbally agrees and request [facility] for in home evaluation and assessment of patient.  Plan: RN Health Coach will notify [facility] of case status. RN Health Coach will send successful outreach letter to patient and family. RN Health Coach will send referral to [facility] Community RN for further in home eval/assessment of care needs and management of chronic conditions.   Rhae LernerFarrah Daviel Allegretto RN [facility] Care Management  RN Health Coach [phone] [email or web address]

## 2017-03-20 NOTE — Patient Outreach (Signed)
[facility](THN) Care Management  03/20/2017  David DamesSatya [name] Aug 11, 1926 [id number]   Telephone Screen  Referral Date:  03/11/2017 [facility] Reason for Referral:  Family requesting continued support Insurance:  Medicare   Outreach Attempt:   Outreach attempt #1 to patient's son (release of information on file/patient with history of Alzheimer's Dementia) for telephone screening. No answer. RN Health Coach left HIPAA compliant voicemail message along with contact information.  Plan:  RN Health Coach will make another outreach attempt within the next 3 business days.   Rhae LernerFarrah Donelle Hise RN [facility] Care Management  RN Health Coach [phone] [email or web address]

## 2017-03-21 ENCOUNTER — Other Ambulatory Visit: Payer: Self-pay | Admitting: *Deleted

## 2017-03-21 NOTE — Patient Outreach (Signed)
[facility](THN) Care Management  03/21/2017  David DamesSatya [name] 09-23-1926 [id number]    RN initial outreach attempt today unsuccessful however RN able toleave a HIPAA approved voice message requesting a call back. Will continue outreach calls accordingly and schedule another outreach next week.  Elliot CousinLisa Matthews, RN Care Management Coordinator [facility] Main Office [phone]

## 2017-03-24 ENCOUNTER — Other Ambulatory Visit: Payer: Self-pay | Admitting: *Deleted

## 2017-03-24 NOTE — Patient Outreach (Signed)
[facility](THN) Care Management  03/24/2017  David [name] 1926/05/03 [id number]    RN received a call back from pt last Friday to return calls to his cell number via voice message. RN returned call today via pt's cell number as provided and noted in Epic at [phone] however only able to leave a HIPAA approved voice message requesting a call back. Will inquire further on pt's possible needs at that time. Will also follow up again this week for pending services.  Elliot CousinLisa Colm Lyford, RN Care Management Coordinator [facility] Main Office [phone]

## 2017-03-25 ENCOUNTER — Other Ambulatory Visit: Payer: Self-pay | Admitting: *Deleted

## 2017-03-25 NOTE — Patient Outreach (Signed)
[facility](THN) Care Management  03/25/2017  Bryson DamesSatya Hogrefe 08-Sep-1926 [id number]    RN spoke with pt's son (Ajay-POA) who indicated the services needed is for his father and he was currently on a flight but provided permission to speak with both his wife ([phone]) and his mother the pt's wife Myna Hidalgo(Karma). Son requested his father have a a nurse who can provide insight on his father's medical condition and how to better manage him in the home with the assistance available. States him and his wife continue to work but the pt's spouse is available and a hired Engineer, productionaide in the home during the day. RN discussed pt's main concerning and possible needed resources. Son has indicated pt has HF with some swelling to his LE and it would be good to know what to do if assist pt with any prevention measures. Discussed a plan of care related to knowledge deficit, daily weights and reducing his ongoing swelling with elevation of his LE. Interventions generated and RN offered a home visit to further engage in this pt's care. Son requested RN contact his wife Neelam and provided a contact number to arrange the initial home visit. RN informed son that RN would communicate with the pt's primary care provider of [facility] and his ongoing process over the next several months.   RN contacted the son's wife (Neelam) and inquired on a scheduled home visit. Neelam requested next Tuesday morning. Will accommodate pt for a morning visit and provide several tools to assist the caregivers in caring for this pt [facility](THN calendar/ EMMI printable information and the HF zones) as a guideline on when to seek medical attention with acute symptoms are presented. Wife also inquired on pt's recent developing broils due to not being turned during the night-RN encouraged caregiver to contact pt's primary provider and inquired on special air mattress as a prevention alterative for ulcers. Wife also inquired on aide  services during the night-RN encouraged caregiver to contact the pt's caseworker via MCD for aide services that maybe available day and/or night prior to paying for out of pocket aide services which is current what the family to covering at this time. Caregiver very appreciative and will follow up with the caseworker and primary provider on possible orders for a pressure ulcer prevention mattress.  Again cargiver very appreciative for the information and resources. Will follow up with a home visit next week and education on plan of care, goals and interventions that will continue to assist this pt.   Elliot CousinLisa Delainie Chavana, RN Care Management Coordinator [facility] Main Office [phone]

## 2017-04-01 ENCOUNTER — Other Ambulatory Visit: Payer: Self-pay | Admitting: *Deleted

## 2017-04-01 ENCOUNTER — Encounter: Payer: Self-pay | Admitting: *Deleted

## 2017-04-01 NOTE — Patient Outreach (Signed)
[facility]) Care Management   04/01/2017  David [name] 1926/11/28 [id number]  David [name] is an 84 y.o. male  Subjective:  HF: Pt and family not aware of HF knowledge and receptive to today's teaching. Pt with diagnosis of Dementia/Alzheimers however pleasant reported by caregiver aide and family. Family requested HF education and resources that maybe able to further assist pt with his ADL.  SWELLING: Pt with bilateral edema to his lower legs but does not wear his compression stockings due to history of blister being treated with Silverdine ointment. States the blister have healed but remain open to air as they continue to heal. Caregiver have requested if pt can wear the compression stockings.  COMMUNITY RESOURCES: Daughter-in-law request assistance for help in the home and states pt has MCD  But not sure how to excess these services.  Objective:   Review of Systems  Constitutional: Negative.   HENT: Negative.   Eyes: Negative.   Respiratory: Negative.   Cardiovascular: Positive for leg swelling.       Bilateral edema noted with 2+ right and 1+ left.  Gastrointestinal: Negative.   Genitourinary: Negative.   Musculoskeletal: Negative.   Skin: Negative.        Healing blisters  Neurological: Negative.   Endo/Heme/Allergies: Negative.   Psychiatric/Behavioral: Negative.     Physical Exam  Constitutional: He appears well-developed and well-nourished.  HENT:  Right Ear: External ear normal.  Left Ear: External ear normal.  Eyes: EOM are normal.  Neck: Normal range of motion.  Cardiovascular: Normal heart sounds.  Respiratory: Effort normal and breath sounds normal.  GI: Soft. Bowel sounds are normal.  Musculoskeletal: He exhibits edema.  Bilateral edema noted  Neurological: He is alert.  Skin: Skin is warm and dry.  Psychiatric: He has a normal mood and affect. His behavior is normal.  Dementia/Alzheimers    Encounter Medications:   Outpatient  Encounter Medications as of 04/01/2017  Medication Sig  . acetaminophen (TYLENOL) 500 MG tablet Take 1 tablet (500 mg total) by mouth every 6 (six) hours as needed for mild pain or moderate pain.  Marland Kitchen apixaban (ELIQUIS) 2.5 MG TABS tablet Take 2.5 mg by mouth 2 (two) times daily.  Marland Kitchen ARICEPT 10 MG tablet TAKE 1 TABLET BY MOUTH EVERY DAY  . B Complex-C (SUPER B COMPLEX PO) Take 1 tablet by mouth daily.   . brimonidine (ALPHAGAN) 0.15 % ophthalmic solution 1 drop 3 (three) times daily. As directed  . Cholecalciferol (VITAMIN D-3) 1000 UNITS CAPS Take 1,000 Units by mouth daily.  . folic acid (FOLVITE) 1 MG tablet Take 1 mg by mouth daily.  . furosemide (LASIX) 20 MG tablet Take 1 tablet (20 mg total) by mouth 2 (two) times daily. Please make yearly appt with Dr. Eldridge Dace for April. 1st attempt  . isosorbide mononitrate (IMDUR) 30 MG 24 hr tablet TAKE ONE TABLET BY MOUTH DAILY  . memantine (NAMENDA) 10 MG tablet Take 1 tablet (10 mg total) by mouth 2 (two) times daily.  Marland Kitchen NITROSTAT 0.6 MG SL tablet Place 0.6 mg under the tongue every 5 (five) minutes as needed for chest pain.   Marland Kitchen omeprazole (PRILOSEC) 20 MG capsule Take 20 mg by mouth daily.  . QUEtiapine (SEROQUEL) 25 MG tablet TAKE ONE-HALF TABLET BY MOUTH TWO TIMES A DAY (Patient taking differently: TAKE ONE-HALF TABLET BY MOUTH AT BEDTIME)  . silver sulfADIAZINE (SILVADENE) 1 % cream Apply 1 application topically daily.  . tamsulosin (FLOMAX) 0.4 MG CAPS capsule Take  0.8 mg by mouth every evening.   Marland Kitchen. amLODipine (NORVASC) 2.5 MG tablet Take 1 tablet by mouth in the evening  . fish oil-omega-3 fatty acids 1000 MG capsule Take 1 g by mouth daily.   . isosorbide mononitrate (IMDUR) 30 MG 24 hr tablet TAKE ONE TABLET BY MOUTH DAILY (Patient not taking: Reported on 04/01/2017)   No facility-administered encounter medications on file as of 04/01/2017.     Functional Status:   In your present state of health, do you have any difficulty performing the  following activities: 04/01/2017  Hearing? N  Vision? N  Difficulty concentrating or making decisions? Y  Walking or climbing stairs? Y  Dressing or bathing? Y  Doing errands, shopping? Y  Preparing Food and eating ? Y  Using the Toilet? Y  In the past six months, have you accidently leaked urine? Y  Do you have problems with loss of bowel control? Y  Managing your Medications? Y  Managing your Finances? Y  Housekeeping or managing your Housekeeping? Y  Some recent data might be hidden    Fall/Depression Screening:    Fall Risk  04/01/2017 03/20/2017 06/11/2016  Falls in the past year? Yes Yes Yes  Number falls in past yr: 2 or more 2 or more 1  Injury with Fall? Yes - Yes  Risk Factor Category  High Fall Risk - -  Risk for fall due to : Impaired balance/gait - -  Follow up Falls prevention discussed - -   PHQ 2/9 Scores 04/01/2017 03/20/2017 12/15/2014  PHQ - 2 Score 0 - 0  Exception Documentation Other- indicate reason in comment box Other- indicate reason in comment box -  Not completed Dementia/Alzheimers did not speak with patient, spoke with son -   BP 118/62 (BP Location: Left Arm, Patient Position: Sitting, Cuff Size: Normal)   Pulse 93   Resp 20   Ht 1.727 m (5\' 8" )   Wt 175 lb (79.4 kg)   SpO2 94%   BMI 26.61 kg/m  Assessment:   Consent for [facility] Case management related to HF Bilateral edema related to LE. Community resource related to MCD benefits  Plan:  Will obtain a signed consent for [facility] services Will educate on HF zones and verified pt remains in the GREEN zone however unable to verify with no weights and swelling to lower legs. Will educate on HF with EMMI printable information on the following: HF: Adult and HF: When to call your doctor or 911. Caregivers with clear understanding on HF symptoms and what to do if acute symptoms occur. Reviewed the HF zones via [facility] calendar provided with the GREEN/YELLOW/RED zones and requested family to review.  Calendar tool provided and RN encouraged caregivers to document all weights for providers to view along with contact numbers to other community resources if needed. Discussed compression stockings and sent communication to pt's primary provider requesting prescription to apply in the home after measurements completed. Will await a reply and apply accordingly (thigh high requested). Encouraged family and caregiver to elevate legs throughout the day to reduce ongoing swelling.  Will provider resources to assist pt further with his ADL/IADLs with his MCD benefits. Will contact the Dept of SS and requested caseworker and CAPS program coordinator Marcello MooresJasmine Buxton along with contact numbers for family to inquire further on available resources. Also discussed Adult daycare services and Senior Resources services that maybe able to provider other services for this pt due to his MCD benefits. Daughter-in-law will call and  inquire further. Family very appreciative and will seek assistance as needed from the available resources. Plan of care review and interventions discussed and adjusted accordingly to allow pt adherence regarding his HF. Will send today's home visit report to provider on pt's disposition with [facility] services.  Elliot Cousin, RN Care Management Coordinator [facility] Main Office [phone]

## 2017-04-11 ENCOUNTER — Other Ambulatory Visit: Payer: Self-pay | Admitting: Neurology

## 2017-04-17 ENCOUNTER — Other Ambulatory Visit: Payer: Self-pay | Admitting: *Deleted

## 2017-04-17 NOTE — Patient Outreach (Signed)
[facility](THN) Care Management  04/17/2017  Bryson DamesSatya Serpa 12/30/1926 [id number]    Pt's daughter called and requested a call back regarding an air mattress. RN returned the call and spoke with the consented daughter who inquired on a air mattress vs. Gel overlay. RN encouraged the daughter to inquired with the pt's provider based upon a skin assessment to determine what would be best for the pt. Explained that this is as assessment the provider performs prior to recommendations on the type of mattress or gel overlay is necessary to prevention further damage to the skin. Daughter verbalized an understanding and will inquired further with the pt's provider. No additional inquires on this call. Based upon the limited time was able to discussed the care plan in short and interventions updated accordingly to what was discussed in the brief conversation today. Daughter reports no swelling and pt's continues to maintain his weight around 174-175 lbs with no encountered issues. Will further update care plan and goal/intervention on the upcoming home visit.  Elliot CousinLisa Geeta Dworkin, RN Care Management Coordinator [facility] Main Office [phone]

## 2017-04-30 ENCOUNTER — Other Ambulatory Visit: Payer: Self-pay | Admitting: *Deleted

## 2017-04-30 NOTE — Patient Outreach (Addendum)
[facility]) Care Management   04/30/2017  David [name] 1926/08/11 [id number]  David [name] is an 84 y.o. male  Subjective:  HF: Caregivers verify they are aware when pt is in distress even with his dementia when he rubs his legs. Caregivers verified pt is in the GREEN zone with his HF with no distressful breathing. Caregiver report only a dry cough in the morning upon pt's arising from his sleep but non-productive.  EDEMA:  Caregiver indicated they have applied his compression stockings however usually elevates his lower legs to reducing swelling throughout the day. States the swelling is much better with the new fluid regimen dosage.  WEIGHTS: Caregivers reports pt's weights over the last month with no 3 lbs or 5 lbs gained over the week. Caregivers deny pt having any acute symptoms related to his HF (CNA hired help) in the home indicates they document all the weights in the [place] with no weight gains over 3 or 5 lbs over the last month since documenting in the book.   Objective:   Review of Systems  Constitutional: Negative.   HENT: Negative.   Eyes: Negative.   Respiratory: Negative.   Cardiovascular: Positive for leg swelling.       Bilateral 1-2+ edema to LE  Gastrointestinal: Negative.   Genitourinary: Negative.   Musculoskeletal: Negative.   Skin: Negative.   Neurological: Negative.   Endo/Heme/Allergies: Negative.   Psychiatric/Behavioral: Negative.     Physical Exam  Constitutional: He is oriented to person, place, and time. He appears well-developed and well-nourished.  HENT:  Right Ear: External ear normal.  Left Ear: External ear normal.  Eyes: EOM are normal.  Neck: Normal range of motion.  Cardiovascular: Normal heart sounds.  Respiratory: Effort normal and breath sounds normal.  GI: Soft. Bowel sounds are normal.  Musculoskeletal: He exhibits edema.  Bilateral 1-2+ edema to LE  Neurological: He is alert and oriented to person,  place, and time.  Skin: Skin is warm and dry.  Psychiatric: He has a normal mood and affect. His behavior is normal. Thought content normal.  Dementia    Encounter Medications:   Outpatient Encounter Medications as of 04/30/2017  Medication Sig  . acetaminophen (TYLENOL) 500 MG tablet Take 1 tablet (500 mg total) by mouth every 6 (six) hours as needed for mild pain or moderate pain.  Marland Kitchen apixaban (ELIQUIS) 2.5 MG TABS tablet Take 2.5 mg by mouth 2 (two) times daily.  . B Complex-C (SUPER B COMPLEX PO) Take 1 tablet by mouth daily.   . brimonidine (ALPHAGAN) 0.15 % ophthalmic solution 1 drop 3 (three) times daily. As directed  . Cholecalciferol (VITAMIN D-3) 1000 UNITS CAPS Take 1,000 Units by mouth daily.  Marland Kitchen donepezil (ARICEPT) 10 MG tablet TAKE ONE TABLET BY MOUTH EVERY NIGHT AT BEDTIME  . fish oil-omega-3 fatty acids 1000 MG capsule Take 1 g by mouth daily.   . folic acid (FOLVITE) 1 MG tablet Take 1 mg by mouth daily.  . furosemide (LASIX) 20 MG tablet Take 1 tablet (20 mg total) by mouth 2 (two) times daily. Please make yearly appt with Dr. Irish Lack for April. 1st attempt  . isosorbide mononitrate (IMDUR) 30 MG 24 hr tablet TAKE ONE TABLET BY MOUTH DAILY  . memantine (NAMENDA) 10 MG tablet Take 1 tablet (10 mg total) by mouth 2 (two) times daily. (Patient taking differently: Take 10 mg by mouth daily. )  . NITROSTAT 0.6 MG SL tablet Place 0.6 mg under the tongue  every 5 (five) minutes as needed for chest pain.   Marland Kitchen omeprazole (PRILOSEC) 20 MG capsule Take 20 mg by mouth daily.  . QUEtiapine (SEROQUEL) 25 MG tablet TAKE ONE-HALF TABLET BY MOUTH TWO TIMES A DAY (Patient taking differently: TAKE ONE-HALF TABLET BY MOUTH AT BEDTIME)  . silver sulfADIAZINE (SILVADENE) 1 % cream Apply 1 application topically daily.  . tamsulosin (FLOMAX) 0.4 MG CAPS capsule Take 0.8 mg by mouth every evening.   Marland Kitchen amLODipine (NORVASC) 2.5 MG tablet Take 1 tablet by mouth in the evening  . isosorbide mononitrate  (IMDUR) 30 MG 24 hr tablet TAKE ONE TABLET BY MOUTH DAILY (Patient not taking: Reported on 04/01/2017)   No facility-administered encounter medications on file as of 04/30/2017.     Functional Status:   In your present state of health, do you have any difficulty performing the following activities: 04/01/2017  Hearing? N  Vision? N  Difficulty concentrating or making decisions? Y  Walking or climbing stairs? Y  Dressing or bathing? Y  Doing errands, shopping? Y  Preparing Food and eating ? Y  Using the Toilet? Y  In the past six months, have you accidently leaked urine? Y  Do you have problems with loss of bowel control? Y  Managing your Medications? Y  Managing your Finances? Y  Housekeeping or managing your Housekeeping? Y  Some recent data might be hidden    Fall/Depression Screening:    Fall Risk  04/01/2017 03/20/2017 06/11/2016  Falls in the past year? Yes Yes Yes  Number falls in past yr: 2 or more 2 or more 1  Injury with Fall? Yes - Yes  Risk Factor Category  High Fall Risk - -  Risk for fall due to : Impaired balance/gait - -  Follow up Falls prevention discussed - -   PHQ 2/9 Scores 04/01/2017 03/20/2017 12/15/2014  PHQ - 2 Score 0 - 0  Exception Documentation Other- indicate reason in comment box Other- indicate reason in comment box -  Not completed Dementia/Alzheimers did not speak with patient, spoke with son -   BP (!) 168/75 (BP Location: Left Arm, Patient Position: Sitting, Cuff Size: Normal)   Pulse 88   Resp 20   Wt 176 lb 8 oz (80.1 kg)   SpO2 96%   BMI 26.84 kg/m   Assessment:   Ongoing case management related to HF Follow up on swelling to lower legs Follow up on weights related to HF  Plan:  Will review the printed material related to pt's HF and verify caregivers have an understanding of acute symptoms and what to do if symptomatic. Will also verify pt remains in the GREEN zone with no acute encounters of the last month since the last home visit.  Will verify caregivers understanding of when to seek medical attention based upon pt's symptoms when noted. Will continue to encourage elevation of his lower legs to prevent ongoing swelling. Will verify pt's swelling is reduce prior to awaking in the morning. Will inform caregivers that the provider's office did not respond to the request for compression stockings however caregiver have been applying if needed based upon their supply. Will verify caregivers are removing the compression stockings prior to pt's bedtime. Will continue to encourage adherence with elevating bilateral lower legs to reduce ongoing swelling. Will review the plan of care with some goals met and adjusted the existing goal with interventions to allow adherence. Will review on next home visit and re-evaluate accordingly. No needed community resources today.  Will scheduled next month's home visit.   Raina Mina, RN Care Management Coordinator [place] Office [phone]

## 2017-05-16 ENCOUNTER — Other Ambulatory Visit: Payer: Self-pay | Admitting: Interventional Cardiology

## 2017-06-03 ENCOUNTER — Ambulatory Visit: Payer: Self-pay | Admitting: *Deleted

## 2017-06-03 ENCOUNTER — Other Ambulatory Visit: Payer: Self-pay | Admitting: *Deleted

## 2017-06-03 NOTE — Patient Outreach (Signed)
[facility] Care Management  06/03/2017  David DamesSatya [name] [birth year] [id number]    Telephone Assessment  RN spoke with daughter in law reports pt continues to do well but receptive to a home visits for this Friday for ongoing update on pt's plan of care. Reports pt had a declyt with is bowls however successful after OTC medications . Discussed colace and prune juice OTC supplements that may help. Daughter states these items were administered and pt has good success.  Pt's daughter able to resite pt's dietary habits prior to his constipation and sure this was the cause. No other inquires or request at this time. Will follow up on Friday and address the care plan at that time.   Elliot CousinLisa Nazaiah Navarrete, RN Care Management Coordinator [facility] Main Office [phone]

## 2017-06-06 ENCOUNTER — Other Ambulatory Visit: Payer: Self-pay | Admitting: *Deleted

## 2017-06-06 NOTE — Patient Outreach (Addendum)
[facility] Care Management   06/06/2017  David DamesSatya [name] 1926-03-14 [id number]  David DamesSatya [name] is an 84 y.o. male  Subjective:  HF: Caregiver continue to report pt is doing well with no encountered problems or issues. Reports pt attending all medical appointments as scheduled and taking all his medications with no encountered issues. Caregiver able to recite some of the HF zones and indicate pt is in the GREEN zone with issues or problems. Caregiver denies any breathing issues and no distress with coughing or related symptoms once reviewed today. Reports pt's weights over the last week 175-176.5 lbs.  EDEMA: Caregiver reports pt always has a history of swelling to his lower legs however with reduced swelling upon awaking in the morning then throughout the day but no more then his usual and pt is on a diuretic and the family monitors the pt's care very well. States they have compression stockings but hardly use due to the minimal swelling pt's has and aware to lubricate his legs and feet to prevent skin issues.  No needed community resources at this time as pt continue to be active with his participation with care daily. Denies any needs at this time.  Objective:   Review of Systems  Constitutional: Negative.   HENT: Negative.   Eyes: Negative.   Respiratory: Negative.   Cardiovascular: Positive for leg swelling.       Bilateral edema to LE at 1-2+(history)  Gastrointestinal: Negative.   Genitourinary: Negative.   Musculoskeletal: Negative.   Skin: Negative.   Neurological: Negative.   Endo/Heme/Allergies: Negative.   Psychiatric/Behavioral: Negative.     Physical Exam  Constitutional: He appears well-developed and well-nourished.  HENT:  Right Ear: External ear normal.  Left Ear: External ear normal.  Eyes: EOM are normal.  Neck: Normal range of motion.  Cardiovascular: Normal heart sounds.  Respiratory: Effort normal and breath sounds normal.  GI: Soft.  Bowel sounds are normal.  Musculoskeletal: He exhibits edema.  Bilateral edema at 1-2+ (history)  Neurological: He is alert.  Skin: Skin is warm and dry.  Psychiatric: He has a normal mood and affect. His behavior is normal.    Encounter Medications:   Outpatient Encounter Medications as of 06/06/2017  Medication Sig  . acetaminophen (TYLENOL) 500 MG tablet Take 1 tablet (500 mg total) by mouth every 6 (six) hours as needed for mild pain or moderate pain.  Marland Kitchen. amLODipine (NORVASC) 2.5 MG tablet Take 1 tablet by mouth in the evening  . apixaban (ELIQUIS) 2.5 MG TABS tablet Take 2.5 mg by mouth 2 (two) times daily.  . B Complex-C (SUPER B COMPLEX PO) Take 1 tablet by mouth daily.   . brimonidine (ALPHAGAN) 0.15 % ophthalmic solution 1 drop 3 (three) times daily. As directed  . Cholecalciferol (VITAMIN D-3) 1000 UNITS CAPS Take 1,000 Units by mouth daily.  Marland Kitchen. donepezil (ARICEPT) 10 MG tablet TAKE ONE TABLET BY MOUTH EVERY NIGHT AT BEDTIME  . fish oil-omega-3 fatty acids 1000 MG capsule Take 1 g by mouth daily.   . folic acid (FOLVITE) 1 MG tablet Take 1 mg by mouth daily.  . furosemide (LASIX) 20 MG tablet Take 1 tablet (20 mg total) by mouth 2 (two) times daily. Please make yearly appt with Dr. Eldridge DaceVaranasi for April. 1st attempt  . isosorbide mononitrate (IMDUR) 30 MG 24 hr tablet TAKE ONE TABLET BY MOUTH DAILY  . memantine (NAMENDA) 10 MG tablet Take 1 tablet (10 mg total) by mouth 2 (two) times daily. (Patient  taking differently: Take 10 mg by mouth daily. )  . NITROSTAT 0.6 MG SL tablet Place 0.6 mg under the tongue every 5 (five) minutes as needed for chest pain.   Marland Kitchen omeprazole (PRILOSEC) 20 MG capsule Take 20 mg by mouth daily.  . QUEtiapine (SEROQUEL) 25 MG tablet TAKE ONE-HALF TABLET BY MOUTH TWO TIMES A DAY (Patient taking differently: TAKE ONE-HALF TABLET BY MOUTH AT BEDTIME)  . silver sulfADIAZINE (SILVADENE) 1 % cream Apply 1 application topically daily.  . tamsulosin (FLOMAX) 0.4 MG  CAPS capsule Take 0.8 mg by mouth every evening.    No facility-administered encounter medications on file as of 06/06/2017.     Functional Status:   In your present state of health, do you have any difficulty performing the following activities: 04/01/2017  Hearing? N  Vision? N  Difficulty concentrating or making decisions? Y  Walking or climbing stairs? Y  Dressing or bathing? Y  Doing errands, shopping? Y  Preparing Food and eating ? Y  Using the Toilet? Y  In the past six months, have you accidently leaked urine? Y  Do you have problems with loss of bowel control? Y  Managing your Medications? Y  Managing your Finances? Y  Housekeeping or managing your Housekeeping? Y  Some recent data might be hidden    Fall/Depression Screening:    Fall Risk  04/01/2017 03/20/2017 06/11/2016  Falls in the past year? Yes Yes Yes  Number falls in past yr: 2 or more 2 or more 1  Injury with Fall? Yes - Yes  Risk Factor Category  High Fall Risk - -  Risk for fall due to : Impaired balance/gait - -  Follow up Falls prevention discussed - -   PHQ 2/9 Scores 04/01/2017 03/20/2017 12/15/2014  PHQ - 2 Score 0 - 0  Exception Documentation Other- indicate reason in comment box Other- indicate reason in comment box -  Not completed Dementia/Alzheimers did not speak with patient, spoke with son -  BP 118/60 (BP Location: Left Arm, Patient Position: Sitting, Cuff Size: Normal)   Pulse (!) 50   Resp 20   Wt 176 lb 8 oz (80.1 kg)   SpO2 93%   BMI 26.84 kg/m   Assessment:  Ongoing case management related to HF Bilateral edema related to LE  Plan: Will verified pt remains in the GREEN zone with no acute or encountered symptoms related to his HF. Will verified pt's daily weights with his documented readings and continue to encouraged adherence with his management of care. Will review the printed material on HF zones once again for increase knowledge base and inquired on what to do if acute symptoms should  occur. Will verify caregivers have a clear understanding on what to do if acute symptoms should occur. Will review pt's weight log over the last 2 weeks with ranges verified between 175-176.5 with no 3 lbs overnight or 5 lbs within one week. Will verified caregivers understand once again the importance of fluid retention and what to do if acute symptoms should occur.  Will continue to encourage pt and caregiver to elevate pt's bilateral lower legs to continue to reduce swelling throughout the day. Will also encourage caregivers to lubricate pt's LE to reduce the risk of skin irritation. Plan of care discussed with goals and interventions for ongoing management of care. Will continue to offer any community resources that may be needed in order to maintain pt's ongoing care. Will discuss pt graduating from the program over the  next 1-2 months based upon his ongoing progress and adherence to his management of care. No inquires or request at this time. Will scheduled the next follow up appointment for next month.   Elliot Cousin, RN Care Management Coordinator [facility] Main Office [phone]

## 2017-06-12 ENCOUNTER — Ambulatory Visit (INDEPENDENT_AMBULATORY_CARE_PROVIDER_SITE_OTHER): Payer: Medicare Other | Admitting: Neurology

## 2017-06-12 ENCOUNTER — Encounter: Payer: Self-pay | Admitting: Neurology

## 2017-06-12 DIAGNOSIS — G301 Alzheimer's disease with late onset: Secondary | ICD-10-CM

## 2017-06-12 DIAGNOSIS — F0280 Dementia in other diseases classified elsewhere without behavioral disturbance: Secondary | ICD-10-CM

## 2017-06-12 NOTE — Patient Instructions (Signed)
I had a long discussion with the patient and his wife and daughter with regards to his Alzheimer's which is now at an advanced stage. Continue Aricept 10 mg daily and Namenda 10 mg twice daily in the current dosages as he has not been able to tolerate higher doses in the past. Continue Seroquel 12.5 mg at night only for his agitation as it seems to be working well but he still has some excessive daytime sleepiness and hence will not increase dose further.. Unfortunately there are no specific medications which will help the progression of Alzheimer's at this stage. The family continues to do a fantastic job of providing care for him at home Return for follow-up in one year or call earlier if necessary

## 2017-06-12 NOTE — Progress Notes (Signed)
PATIENT: David [name] DOB: 07/21/1926   REASON FOR VISIT: follow up for Alzheimer`s dementia HISTORY FROM: daughter and wife  HISTORY OF PRESENT ILLNESS: 84 year old patient with mild dementia x2 years etiology unclear-senile dementia versus early Alzheimer's. He appears clinically stable on objective testing the family feels is slightly worse.  01/15/12 (PS):  He returns for followup after last visit on 10/17/2010. Patient's son note they have noted progressive cognitive decline over the last one year. At last visit I had suggested increasing Aricept to 23 mg which he could not tolerate and reduced the dose back to 10 mg within a week. He is now having difficulty with completing sentences and word finding and at times forgets names of family members. He also has good and bad days. He at times is confused and disoriented. He has not had any significant difficulties with walking or falls. He remains on Aricept 10 mg daily as well as Namenda twice a day and fish oil. He keeps himself busy and mentally challenging tasks across her puzzles, [employer] and even [email or web address] but he has been needing more supervision. He is complaining of mild headache today and states he is having a bad day it did not do well on the Mini-Mental testing and scored only 13/30 with my medical assistant. After I administered the test prostrating the questions to him he is slightly better and scored 15/30 but clearly stated that he knew the answers but could not come up with him today.  UPDATE 03/24/13 (LL): Patient returns for a yearly followup visit his last visit was 01/15/2012.  He is accompanied by his daughter-in-law today.  His memory appears stable, MMSE score today is 18/30.  He has significant difficulty completing sentences and finding words that he wants to say.  He is in good mood and daughter-in-law states that he normally is. Patient has no complaints today.  He is tolerating Aricept 10 mg daily and Namenda 10 mg  twice a day well with no known side effects.  UPDATE 12/30/2013 (PS) :he returns for follow-up accompanied by his son. He seems to be doing about the same. He had a recent trip to [place] with his wife for a month to spend time with his daughters. Continues to have memory and cognitive difficulties which are essentially unchanged. He has occasional time finding words and completing sentences. Is usually in a good mood. He is tolerating Aricept 10 mg daily but for some reason is on Namenda 10 mg once a day only. He is having no GI or CNS side effects. He has had trouble tolerating the higher dose of Aricept and Namenda XR in the past.18/30 which is unchanged from last visit  Update 04/20/2014 : He returns for follow-up today accompanied by his wife and son-in-law after last visit 3 months ago. The family is concerned about increased agitation, irritability, confusion and disorientation. He was previously going to [facility] for 3-4 hours twice a week but recently he has been reluctant to do so and wants to go home after only a couple of hours. The wife states that she is at times finding it difficult to redirect him and tell him. He tends to reminiscent about his past and at times talks about his parents who were there been long dead. There are no concerns about violence, fall risk, gait or balance problems. He remains on Aricept 10 mg daily and Namenda 10 mg twice daily. He has not been able to tolerate higher doses of both medications  in the past. Patient has previously not been diagnosed with depression but on today's testing he scored 8 on the geriatric depression scale. His Mini-Mental status score today is 17/30 which is stable from last visit. He has had no other new health problems since her last visit.  Update 08/31/14: Patient returns for follow-up today accompanied by his daughter. The family continued have concerns about increased agitation, irritability, confusion and disorientation.  He was prescribed with depakote at last visit. However, according to Dr. Zannie Cove note, pt never took it as "it is too strong". He was prescribed with seroquel and family gives him 12.5mg  every night. He seems responding to medication well and the agitation issues seems to improved. He normally has sun downing in the evenings, and seroquel helps. However, recently, he has 2-3 episodes of agitation, confusion in the afternoon. For example, he closes all the curtains, shut the door in the afternoon, saying he has to go attend meeting and people is waiting for him, which is unusual for him that early during the day. Family concerned about the progression and asked earlier follow up in clinic. Had MMSE testing in clinic which is 8/30. However, in clinic today, pt is calm and polite, and cooperative.  Update 12/15/2014 : He returns for follow-up today after last visit with Dr. Roda Shutters 3 months ago. He was having issues with agitation during the day at that visit and that seems to have improved after low dose Seroquel 12.5 mg was used as needed during the daytime. He continues to take 25 mg at night which seems to help. His been having some intermittent issues with incontinence particularly at night. He has been taking Lasix 20 mg twice daily for leg swelling. He does go to [facility] twice a week but usually gets tired than a few hours and call us to be brought home. He does have a caregiver who comes for a few hours. Patient's family continues to take excellent care of him at home. He has not been able to tolerate higher doses of Aricept and Namenda in the past but remains on Aricept 10 mg daily and Namenda 10 g twice daily which is tolerating well. He has had no major falls but does need little help and direction with getting up and walking. There have been no delusions or hallucinations and wandering or unsafe behavior. She does continue to get confused and have poor short-term memory and attention. The family feels gradually they  have seen a decline over the last 1 year. Update 06/15/2015 : He returns for follow-up after last visit 6 months ago. Is accompanied by his wife and daughter. The patient continues to require 24-hour care. His behavior seems reasonably well controlled on the current dose of Seroquel 12.5 twice daily but he still is sleepy during the day. He is able to sleep most nights quite well. He continues to have bladder issues particularly in the evening when he cannot reach the restroom or call. He does wear a diaper but during the daytime he can use a urinal. There have been no issues with agitation, unsafe behavior, delusions, hallucinations. He remains on Aricept 10 mg daily and Namenda 10 mg twice daily and is tolerating both medications well. He did spend a few months in [place] with his daughter and the trip went well without major incident. The family continues to do a great job in and provide 24-hour supervision and care at home. They did discuss with his cardiologist whether to stop Lasix but decided  to continue it due to leg swelling Update 06/11/2016 : He returns for f/u after last visit 1 year ago. He is accompanied by his wife and caregiver. He  continues to require 24-hour care. His behavior seems reasonably well controlled on the current dose of Seroquel 12.5 twice daily but he still is sleepy during the day. He is able to sleep most nights quite well.He does wear a diaper but during the daytime he can use a urinal. There have been no issues with agitation, unsafe behavior, delusions, hallucinations. He remains on Aricept 10 mg daily and Namenda 10 mg twice daily and is tolerating both medications well.He now has a hospital bed. He does not wander. He gets confused but can be redirected easily but needs constant supervision.He has been having some gait and balance difficulties and needs to be watched and directed. He had a fall last month and was seen in ED for facial laceration needing stitches. 06/12/2017  : he returns for follow-up after last visit with me a year ago. He is accompanied by his wife and daughter. Patient continues to have significant cognitive impairment and has 24-hour care at home. He communicates very little and speaks in his native language. He requires close supervision given for walking as well asbasic activities. He was diagnosed with deep and thrombosis in both legs 4 months ago and has been started on eliquis which he seems to be tolerating well without any bleeding. Patient has had no falls. He remains on Aricept 10 mg daily and Namenda 10 mg twice daily which seems to be tolerating well. The patient has bilateral leg swelling and family has been educated to keep his legs were raised to reduce the edema. REVIEW OF SYSTEMS: Full 14 system review of systems performed and notable only for: leg swelling, snoring, incontinence of bowels and bladder, joint pain and swelling, walking difficulty, or skin rash, itching, confusion and all systems negative  and all other systems negative ALLERGIES: Allergies  Allergen Reactions  . Coreg [Carvedilol]     bradycardia  . Norvasc [Amlodipine Besylate]     swelling    HOME MEDICATIONS: Outpatient Medications Prior to Visit  Medication Sig Dispense Refill  . acetaminophen (TYLENOL) 500 MG tablet Take 1 tablet (500 mg total) by mouth every 6 (six) hours as needed for mild pain or moderate pain. 30 tablet 0  . amLODipine (NORVASC) 2.5 MG tablet Take 1 tablet by mouth in the evening  11  . apixaban (ELIQUIS) 2.5 MG TABS tablet Take 2.5 mg by mouth 2 (two) times daily.    . B Complex-C (SUPER B COMPLEX PO) Take 1 tablet by mouth daily.     . brimonidine (ALPHAGAN) 0.15 % ophthalmic solution 1 drop 3 (three) times daily. As directed    . Cholecalciferol (VITAMIN D-3) 1000 UNITS CAPS Take 1,000 Units by mouth daily.    Marland Kitchen. donepezil (ARICEPT) 10 MG tablet TAKE ONE TABLET BY MOUTH EVERY NIGHT AT BEDTIME 90 tablet 2  . fish oil-omega-3 fatty acids  1000 MG capsule Take 1 g by mouth daily.     . folic acid (FOLVITE) 1 MG tablet Take 1 mg by mouth daily.    . furosemide (LASIX) 20 MG tablet Take 1 tablet (20 mg total) by mouth 2 (two) times daily. Please make yearly appt with Dr. Eldridge DaceVaranasi for April. 1st attempt 180 tablet 0  . isosorbide mononitrate (IMDUR) 30 MG 24 hr tablet TAKE ONE TABLET BY MOUTH DAILY 30 tablet 0  .  memantine (NAMENDA) 10 MG tablet Take 1 tablet (10 mg total) by mouth 2 (two) times daily. (Patient taking differently: Take 10 mg by mouth daily. ) 60 tablet 11  . NITROSTAT 0.6 MG SL tablet Place 0.6 mg under the tongue every 5 (five) minutes as needed for chest pain.     Marland Kitchen omeprazole (PRILOSEC) 20 MG capsule Take 20 mg by mouth daily.    . QUEtiapine (SEROQUEL) 25 MG tablet TAKE ONE-HALF TABLET BY MOUTH TWO TIMES A DAY (Patient taking differently: TAKE ONE-HALF TABLET BY MOUTH AT BEDTIME) 60 tablet 5  . silver sulfADIAZINE (SILVADENE) 1 % cream Apply 1 application topically daily.    . tamsulosin (FLOMAX) 0.4 MG CAPS capsule Take 0.8 mg by mouth every evening.      No facility-administered medications prior to visit.     PAST MEDICAL HISTORY: Past Medical History:  Diagnosis Date  . Alzheimer disease   . CKD (chronic kidney disease), stage III (HCC)   . Colon polyp 2007  . Elevated PSA    4-7 in 2009  . Glaucoma   . Hernia, inguinal   . High cholesterol   . Hypercholesteremia   . Hyperlipemia   . Hypertension    grade 1 diastolic dysfunction, trace MR and TR, mild LVH Echo 3-/4  . Low back pain   . Macular degeneration   . Memory deficit   . Prostate disease   . Urinary incontinence     PAST SURGICAL HISTORY: Past Surgical History:  Procedure Laterality Date  . GALLBLADDER SURGERY  1996    FAMILY HISTORY: Family History  Problem Relation Age of Onset  . Dementia Mother   . Cancer Daughter   . Stroke Sister        X6  . Dementia Sister   . Heart attack Neg Hx   . Hypertension Neg Hx      SOCIAL HISTORY: Social History   Socioeconomic History  . Marital status: Married    Spouse name: Myna Hidalgo  . Number of children: 4  . Years of education: College  . Highest education level: Not on file  Occupational History  . Occupation: retired  Engineer, production  . Financial resource strain: Not on file  . Food insecurity:    Worry: Not on file    Inability: Not on file  . Transportation needs:    Medical: Not on file    Non-medical: Not on file  Tobacco Use  . Smoking status: Never Smoker  . Smokeless tobacco: Never Used  Substance and Sexual Activity  . Alcohol use: No    Alcohol/week: 0.0 oz  . Drug use: No  . Sexual activity: Not on file  Lifestyle  . Physical activity:    Days per week: Not on file    Minutes per session: Not on file  . Stress: Not on file  Relationships  . Social connections:    Talks on phone: Not on file    Gets together: Not on file    Attends religious service: Not on file    Active member of club or organization: Not on file    Attends meetings of clubs or organizations: Not on file    Relationship status: Not on file  . Intimate partner violence:    Fear of current or ex partner: Not on file    Emotionally abused: Not on file    Physically abused: Not on file    Forced sexual activity: Not on file  Other Topics  Concern  . Not on file  Social History Narrative   Patient is married with 4 children.   Patient is right handed.   Patient has college education.   Caffeine Use: 3 cups daily      PHYSICAL EXAM  There were no vitals filed for this visit. There is no height or weight on file to calculate BMI.  Generalized: pleasant elderly [place] male, in no acute distress, pleasant elderly [place] male. Head: normocephalic and atraumatic.   Neck: Supple, no carotid bruits  Cardiac: Regular rate rhythm, no murmur  Musculoskeletal: small sebaceous cyst on the forehead in midline  Neurological examination   Mentation: awake alert. oriented to  Person only. Speech is minimal but can speak few sentences in his native language. Diminished attention, registration and recall. Follows one-step commands, has difficulty with some two-step commands speech is clear but nonfluent. Palmomental reflexes present. Snout absent.  mini-mental status exam not done    Cranial nerve II-XII:  Pupils were equal round reactive to light extraocular movements were full, visual field were full on confrontational test. Facial sensation and strength were normal. hearing was intact to finger rubbing bilaterally. Uvula tongue midline. head turning and shoulder shrug and were normal and symmetric.Tongue protrusion into cheek strength was normal. Motor: normal bulk and tone, full strength in the BUE, BLE, fine finger movements normal, no pronator drift. No focal weakness Sensory: normal and symmetric to light touch Coordination: finger-nose-finger, heel-to-shin bilaterally, no dysmetria Reflexes:  Deep tendon reflexes in the upper and lower extremities are present and symmetric.  Gait and Station: gait iniation mild apraxia and some diffculty in arising from chair but broad-based gait with leaning to left and unable to do tandem walking; no festination; minimal apraxia while turning.   ASSESSMENT AND PLAN 84 y.o. year old male  has a past medical history of Hyperlipemia; High cholesterol; Prostate disease; Macular degeneration; and Glaucoma here with progressive dementia for 4-5 years etiology now likely advanced Alzheimer's   PLAN: I had a long discussion with the patient and his wife and daughter with regards to his Alzheimer's which is now at an advanced stage. Continue Aricept 10 mg daily and Namenda 10 mg twice daily in the current dosages as he has not been able to tolerate higher doses in the past. Continue Seroquel 12.5 mg at night only for his agitation as it seems to be working well but he still has some excessive daytime  sleepiness and hence will not increase dose further.. Unfortunately there are no specific medications which will help the progression of Alzheimer's at this stage. The family continues to do a fantastic job of providing care for him at home Return for follow-up in one year or call earlier if necessaryGreater than 50% time during this 25 minute visit was spent on counseling and coordination of care about his advance Alzheimer's. Greater than 50% time during this 25 minute visit was spent on counseling and coordination of care about his advance Alzheimer's                                               Return in about 1 year (around 06/13/2018).  Delia Heady, MD Stroke Neurology 06/12/2017 5:13 PM

## 2017-06-13 ENCOUNTER — Other Ambulatory Visit: Payer: Self-pay | Admitting: Interventional Cardiology

## 2017-07-01 ENCOUNTER — Other Ambulatory Visit: Payer: Self-pay | Admitting: *Deleted

## 2017-07-01 ENCOUNTER — Encounter: Payer: Self-pay | Admitting: *Deleted

## 2017-07-01 NOTE — Progress Notes (Signed)
Cardiology Office Note   Date:  07/03/2017   ID:  David [name], DOB Oct 17, 1926, MRN [id number]  PCP:  Georgann Housekeeper, MD    No chief complaint on file.  Abnormal stress test  Wt Readings from Last 3 Encounters:  07/03/17 176 lb 12.8 oz (80.2 kg)  07/01/17 172 lb (78 kg)  06/06/17 176 lb 8 oz (80.1 kg)       History of Present Illness: David [name] is a 84 y.o. male  With dementia who had an abnormal stress test several years ago and anginal symptoms. Due to renal insufficiency, he has been managed medically. He has multiple risk factors for heart disease.  He has not been walking much. His wife states that he is just getting more sedentary. His dementia is also progressing.   Denies : Chest pain. Dizziness.  Nitroglycerin use. Orthopnea. Palpitations. Paroxysmal nocturnal dyspnea. Shortness of breath. Syncope.   He has developed a rash on his left foot.  It is on the sides of his feet and onto the sole.  It is itching and it just started a couple of days ago.  There is mild right leg edema which is worse than the left.  DVT diagnosed in September 2018 in the right leg.  Tolerating Eliquis.  Past Medical History:  Diagnosis Date  . Alzheimer disease   . CKD (chronic kidney disease), stage III (HCC)   . Colon polyp 2007  . Elevated PSA    4-7 in 2009  . Glaucoma   . Hernia, inguinal   . High cholesterol   . Hypercholesteremia   . Hyperlipemia   . Hypertension    grade 1 diastolic dysfunction, trace MR and TR, mild LVH Echo 3-/4  . Low back pain   . Macular degeneration   . Memory deficit   . Prostate disease   . Urinary incontinence     Past Surgical History:  Procedure Laterality Date  . GALLBLADDER SURGERY  1996     Current Outpatient Medications  Medication Sig Dispense Refill  . acetaminophen (TYLENOL) 500 MG tablet Take 1 tablet (500 mg total) by mouth every 6 (six) hours as needed for mild pain or moderate pain. 30 tablet 0  . apixaban  (ELIQUIS) 2.5 MG TABS tablet Take 2.5 mg by mouth 2 (two) times daily.    . B Complex-C (SUPER B COMPLEX PO) Take 1 tablet by mouth daily.     . brimonidine (ALPHAGAN) 0.15 % ophthalmic solution 1 drop 3 (three) times daily. As directed    . Cholecalciferol (VITAMIN D-3) 1000 UNITS CAPS Take 1,000 Units by mouth daily.    Marland Kitchen donepezil (ARICEPT) 10 MG tablet TAKE ONE TABLET BY MOUTH EVERY NIGHT AT BEDTIME 90 tablet 2  . fish oil-omega-3 fatty acids 1000 MG capsule Take 1 g by mouth daily.     . folic acid (FOLVITE) 1 MG tablet Take 1 mg by mouth daily.    . furosemide (LASIX) 20 MG tablet Take 1 tablet (20 mg total) by mouth 2 (two) times daily. Please make yearly appt with Dr. Eldridge Dace for April. 1st attempt 180 tablet 0  . isosorbide mononitrate (IMDUR) 30 MG 24 hr tablet TAKE ONE TABLET BY MOUTH DAILY 30 tablet 0  . memantine (NAMENDA) 10 MG tablet Take 1 tablet (10 mg total) by mouth 2 (two) times daily. (Patient taking differently: Take 10 mg by mouth daily. ) 60 tablet 11  . NITROSTAT 0.6 MG SL tablet Place 0.6 mg under  the tongue every 5 (five) minutes as needed for chest pain.     Marland Kitchen omeprazole (PRILOSEC) 20 MG capsule Take 20 mg by mouth daily.    . QUEtiapine (SEROQUEL) 25 MG tablet TAKE ONE-HALF TABLET BY MOUTH TWO TIMES A DAY (Patient taking differently: TAKE ONE-HALF TABLET BY MOUTH AT BEDTIME) 60 tablet 5  . silver sulfADIAZINE (SILVADENE) 1 % cream Apply 1 application topically daily.    . tamsulosin (FLOMAX) 0.4 MG CAPS capsule Take 0.8 mg by mouth every evening.      No current facility-administered medications for this visit.     Allergies:   Coreg [carvedilol] and Norvasc [amlodipine besylate]    Social History:  The patient  reports that he has never smoked. He has never used smokeless tobacco. He reports that he does not drink alcohol or use drugs.   Family History:  The patient's family history includes Cancer in his daughter; Dementia in his mother and sister; Stroke in  his sister.    ROS:  Please see the history of present illness.   Otherwise, review of systems are positive for rash on foot.   All other systems are reviewed and negative.    PHYSICAL EXAM: VS:  BP (!) 144/64   Pulse 85   Ht  (1.727 m)   Wt 176 lb 12.8 oz (80.2 kg)   SpO2 98%   BMI 26.88 kg/m  , BMI Body mass index is 26.88 kg/m. GEN: Well nourished, well developed, in no acute distress  HEENT: normal  Neck: no JVD, carotid bruits, or masses Cardiac: RRR; no murmurs, rubs, or gallops,no edema  Respiratory:  clear to auscultation bilaterally, normal work of breathing GI: soft, nontender, nondistended, + BS MS: no deformity or atrophy  Skin: warm and dry, no rash Neuro:  Strength and sensation are intact Psych: euthymic mood, full affect   EKG:   The ekg ordered today demonstrates NSR, PVC, RBBB   Recent Labs: No results found for requested labs within last 8760 hours.   Lipid Panel No results found for: CHOL, TRIG, HDL, CHOLHDL, VLDL, LDLCALC, LDLDIRECT   Other studies Reviewed: Additional studies/ records that were reviewed today with results demonstrating: 9/18 LE u/s reviewed.   ASSESSMENT AND PLAN:  1. CAD/abnormal stress test: No angina.  Now on low-dose Eliquis due to DVT. 2. Hyperlipidemia: LDL 163.  This is above target.  Off of statin at this time.  Given memory issues and other comorbidities, he may want to avoid statins.   3. HTN: Stable at home.  Continue current medications. 4. CRI: Stable.  Most recent creatinine in January 2019 was 1.27. 5. Edema/Chronic diastolic heart failure: Mild leg swelling.  Continue leg elevation.  Higher dose diuretics have been a problem due to incontinence. 6. Rash: Possible fungal rash on the left foot.  With the family's permission, I took a picture of the rash and sent it to Dr. Eula Listen.  I spoke to Dr. Eula Listen later and he will follow-up with the patient.   Current medicines are reviewed at length with the patient  today.  The patient concerns regarding his medicines were addressed.  The following changes have been made:  No change  Labs/ tests ordered today include:  No orders of the defined types were placed in this encounter.   Recommend 150 minutes/week of aerobic exercise Low fat, low carb, high fiber diet recommended  Disposition:   FU in 1 year   Signed, Lance Muss, MD  07/03/2017 11:33  AM    [facility] [address], [place], [place]  [address] Phone: [phone]; Fax: [phone]

## 2017-07-01 NOTE — Patient Outreach (Signed)
David [name]) Care Management   07/01/2017  Lido Maske [birth year] [id number]  David [name] is an 84 y.o. male  Subjective:  HF: Reports pt continue to complete daily weights and document all readings with no 3 lbs overnight or 5 lbs within one week. Reports ongoing swelling to his right leg however this is a history and remains at a minimal.  RASH: Reports pt has developed a rash to his left foot and caregiver indicates some itching and several area has appeared. Caregiver recently applied Lotrium and hydrocodone ointment and will awaiting results. Aware to continue pt's provider if no improvement. Caregiver has taken pictures to locate the areas to monitor if it gets worse or improves.   Objective:   Review of Systems  Constitutional: Negative.   HENT: Negative.   Eyes: Negative.   Respiratory: Negative.   Cardiovascular: Positive for leg swelling.       History of LE swelling to his right LE.  Gastrointestinal: Negative.   Genitourinary: Negative.   Musculoskeletal: Negative.   Skin: Positive for itching and rash.       Rash noted to Left lower foot with some itching.  Neurological: Negative.   Endo/Heme/Allergies: Negative.   Psychiatric/Behavioral: Negative.     Physical Exam  Constitutional: He is oriented to person, place, and time. He appears well-developed and well-nourished.  HENT:  Right Ear: External ear normal.  Left Ear: External ear normal.  Eyes: EOM are normal.  Neck: Normal range of motion.  Cardiovascular: Normal heart sounds.  Respiratory: Effort normal and breath sounds normal.  GI: Soft. Bowel sounds are normal.  Musculoskeletal: Normal range of motion.  Neurological: He is alert and oriented to person, place, and time.  Skin: Skin is warm and dry.  Psychiatric: He has a normal mood and affect. His behavior is normal. Judgment and thought content normal.    Encounter Medications:   Outpatient Encounter Medications as of  07/01/2017  Medication Sig  . acetaminophen (TYLENOL) 500 MG tablet Take 1 tablet (500 mg total) by mouth every 6 (six) hours as needed for mild pain or moderate pain.  Marland Kitchen amLODipine (NORVASC) 2.5 MG tablet Take 1 tablet by mouth in the evening  . apixaban (ELIQUIS) 2.5 MG TABS tablet Take 2.5 mg by mouth 2 (two) times daily.  . B Complex-C (SUPER B COMPLEX PO) Take 1 tablet by mouth daily.   . brimonidine (ALPHAGAN) 0.15 % ophthalmic solution 1 drop 3 (three) times daily. As directed  . Cholecalciferol (VITAMIN D-3) 1000 UNITS CAPS Take 1,000 Units by mouth daily.  Marland Kitchen donepezil (ARICEPT) 10 MG tablet TAKE ONE TABLET BY MOUTH EVERY NIGHT AT BEDTIME  . folic acid (FOLVITE) 1 MG tablet Take 1 mg by mouth daily.  . furosemide (LASIX) 20 MG tablet Take 1 tablet (20 mg total) by mouth 2 (two) times daily. Please make yearly appt with Dr. Irish Lack for April. 1st attempt  . isosorbide mononitrate (IMDUR) 30 MG 24 hr tablet TAKE ONE TABLET BY MOUTH DAILY  . memantine (NAMENDA) 10 MG tablet Take 1 tablet (10 mg total) by mouth 2 (two) times daily. (Patient taking differently: Take 10 mg by mouth daily. )  . NITROSTAT 0.6 MG SL tablet Place 0.6 mg under the tongue every 5 (five) minutes as needed for chest pain.   Marland Kitchen omeprazole (PRILOSEC) 20 MG capsule Take 20 mg by mouth daily.  . QUEtiapine (SEROQUEL) 25 MG tablet TAKE ONE-HALF TABLET BY MOUTH TWO TIMES A DAY (Patient taking  differently: TAKE ONE-HALF TABLET BY MOUTH AT BEDTIME)  . tamsulosin (FLOMAX) 0.4 MG CAPS capsule Take 0.8 mg by mouth every evening.   . fish oil-omega-3 fatty acids 1000 MG capsule Take 1 g by mouth daily.   . silver sulfADIAZINE (SILVADENE) 1 % cream Apply 1 application topically daily.   No facility-administered encounter medications on file as of 07/01/2017.     Functional Status:   In your present state of health, do you have any difficulty performing the following activities: 04/01/2017  Hearing? N  Vision? N  Difficulty  concentrating or making decisions? Y  Walking or climbing stairs? Y  Dressing or bathing? Y  Doing errands, shopping? Y  Preparing Food and eating ? Y  Using the Toilet? Y  In the past six months, have you accidently leaked urine? Y  Do you have problems with loss of bowel control? Y  Managing your Medications? Y  Managing your Finances? Y  Housekeeping or managing your Housekeeping? Y  Some recent data might be hidden    Fall/Depression Screening:    Fall Risk  04/01/2017 03/20/2017 06/11/2016  Falls in the past year? Yes Yes Yes  Number falls in past yr: 2 or more 2 or more 1  Injury with Fall? Yes - Yes  Risk Factor Category  High Fall Risk - -  Risk for fall due to : Impaired balance/gait - -  Follow up Falls prevention discussed - -   PHQ 2/9 Scores 04/01/2017 03/20/2017 12/15/2014  PHQ - 2 Score 0 - 0  Exception Documentation Other- indicate reason in comment box Other- indicate reason in comment box -  Not completed Dementia/Alzheimers did not speak with patient, spoke with son -   BP (!) 118/56 (BP Location: Left Arm, Patient Position: Sitting, Cuff Size: Normal)   Pulse 70   Resp 20   Wt 172 lb (78 kg)   SpO2 97%   BMI 26.15 kg/m  Assessment:  Follow up case management related to HF Rash like areas related to left foot  Plan: Will verify ongoing daily weights with documentation in the [facility] calendar with no major changes other then his usual swelling to his right LE at 1+. Will verify pt remains in the GREEN zone with no acute symptoms. Plan of care discussed with all goals related to HF met. Will assess rash like to left foot as CNA applied OTC Lotrium and hydrocortisone ointment along with photos to monitor. RN encouraged caregiver to contact the provider if no improvement over the next few days. Will develop a plan of care to address in a few weeks on interventions and/or resolution. Will follow up with a telephone assessment next month on pt's progress.  Raina Mina,  RN Care Management Coordinator [place] Office [phone]

## 2017-07-03 ENCOUNTER — Ambulatory Visit (INDEPENDENT_AMBULATORY_CARE_PROVIDER_SITE_OTHER): Payer: Medicare Other | Admitting: Interventional Cardiology

## 2017-07-03 ENCOUNTER — Encounter: Payer: Self-pay | Admitting: Interventional Cardiology

## 2017-07-03 VITALS — BP 144/64 | HR 85 | Ht 68.0 in | Wt 176.8 lb

## 2017-07-03 DIAGNOSIS — I25118 Atherosclerotic heart disease of native coronary artery with other forms of angina pectoris: Secondary | ICD-10-CM

## 2017-07-03 DIAGNOSIS — R21 Rash and other nonspecific skin eruption: Secondary | ICD-10-CM | POA: Diagnosis not present

## 2017-07-03 DIAGNOSIS — R600 Localized edema: Secondary | ICD-10-CM | POA: Diagnosis not present

## 2017-07-03 DIAGNOSIS — N183 Chronic kidney disease, stage 3 unspecified: Secondary | ICD-10-CM

## 2017-07-03 DIAGNOSIS — I5032 Chronic diastolic (congestive) heart failure: Secondary | ICD-10-CM

## 2017-07-03 DIAGNOSIS — E782 Mixed hyperlipidemia: Secondary | ICD-10-CM

## 2017-07-03 DIAGNOSIS — I10 Essential (primary) hypertension: Secondary | ICD-10-CM | POA: Diagnosis not present

## 2017-07-03 MED ORDER — NITROGLYCERIN 0.6 MG SL SUBL: 0.6000 mg | SUBLINGUAL_TABLET | SUBLINGUAL | 3 refills | Status: AC | PRN

## 2017-07-03 NOTE — Patient Instructions (Signed)

## 2017-07-16 ENCOUNTER — Other Ambulatory Visit: Payer: Self-pay | Admitting: Interventional Cardiology

## 2017-07-29 ENCOUNTER — Other Ambulatory Visit: Payer: Self-pay | Admitting: Neurology

## 2017-08-01 ENCOUNTER — Encounter: Payer: Self-pay | Admitting: *Deleted

## 2017-08-01 ENCOUNTER — Other Ambulatory Visit: Payer: Self-pay | Admitting: *Deleted

## 2017-08-01 NOTE — Patient Outreach (Signed)
[place]) Care Management  08/01/2017  David [name] 1926/06/23 [id number]    RN spoke with pt's wife David [name]) who provided an update indicating the previous rash to his foot have resolved with no additional irritation. States pt continue to weigh daily with no weight gained and no acute symptoms of HF remains in the GREEN zone when discussed however pt continue to have swelling to his LE by the end of the day. Note pt is wheelchair bound and not able to ambulate. Wife states "they are not that swelling in the mornings". Pt cont' to take all his medications and has been attended all his medial appointments with sufficient transportation by the hired help in the home. Plan of care discussed with all goals met and caregiver encouraged to continue to assist with pt's ongoing management of care. RN will discussed discharge of pt via graduating from [facility] services. Verified family has contact number for this RN case management and can call anytime with his inquires. Case will be closed and provider will be notified. No other team members involved at this time.   Raina Mina, RN Care Management Coordinator [place] Office [phone]

## 2017-08-06 ENCOUNTER — Ambulatory Visit: Payer: Medicare Other | Admitting: Rheumatology

## 2017-10-08 ENCOUNTER — Ambulatory Visit (INDEPENDENT_AMBULATORY_CARE_PROVIDER_SITE_OTHER): Payer: Medicare Other | Admitting: Podiatry

## 2017-10-08 ENCOUNTER — Encounter: Payer: Self-pay | Admitting: Podiatry

## 2017-10-08 DIAGNOSIS — B351 Tinea unguium: Secondary | ICD-10-CM | POA: Diagnosis not present

## 2017-10-08 DIAGNOSIS — M79671 Pain in right foot: Secondary | ICD-10-CM | POA: Diagnosis not present

## 2017-10-08 DIAGNOSIS — M79672 Pain in left foot: Secondary | ICD-10-CM | POA: Diagnosis not present

## 2017-10-08 NOTE — Patient Instructions (Signed)
Seen for hypertrophic nails. All nails debrided. Return in 3 months or as needed.  

## 2017-10-08 NOTE — Progress Notes (Signed)
Subjective: 84 y.o. year old male patient presents accompanied by his wife and a care taker for problematic toe nails. Both big toe nails are hurting him. His last visit to this office was 09/14/15 for painful nails. Patient was diagnosed with Alzheimer's and having difficulty walking.  Objective: Dermatologic: Thick yellow deformed nails with fungal debris. Vascular: Pedal pulses are all palpable. Orthopedic: Contracted lesser digits bilateral. Neurologic: All epicritic and tactile sensations grossly intact.  Assessment: Dystrophic mycotic nails x 10. Pain in both feet.  Treatment: All mycotic nails debrided.  Return in 3 months or sooner if needed.

## 2017-11-26 ENCOUNTER — Other Ambulatory Visit: Payer: Self-pay | Admitting: Neurology

## 2018-01-07 ENCOUNTER — Other Ambulatory Visit: Payer: Self-pay | Admitting: Neurology

## 2018-02-19 NOTE — Progress Notes (Deleted)
Office Visit Note  Patient: David [name]             Date of Birth: 08-20-1926           MRN: [id number]             PCP: Georgann HousekeeperHusain, Karrar, MD Referring: Georgann HousekeeperHusain, Karrar, MD Visit Date: 03/05/2018 Occupation: @GUAROCC @  Subjective:  No chief complaint on file.   History of Present Illness: David [name] is a 85 y.o. male ***   Activities of Daily Living:  Patient reports morning stiffness for *** {minute/hour:19697}.   Patient {ACTIONS;DENIES/REPORTS:21021675::"Denies"} nocturnal pain.  Difficulty dressing/grooming: {ACTIONS;DENIES/REPORTS:21021675::"Denies"} Difficulty climbing stairs: {ACTIONS;DENIES/REPORTS:21021675::"Denies"} Difficulty getting out of chair: {ACTIONS;DENIES/REPORTS:21021675::"Denies"} Difficulty using hands for taps, buttons, cutlery, and/or writing: {ACTIONS;DENIES/REPORTS:21021675::"Denies"}  No Rheumatology ROS completed.   PMFS History:  Patient Active Problem List   Diagnosis Date Noted  . Primary osteoarthritis of both knees 10/15/2016  . Dyslipidemia 07/16/2016  . History of glaucoma 07/16/2016  . Bilateral lower extremity edema 06/06/2016  . Chronic pain of both knees 04/17/2016  . Chronic left shoulder pain 04/17/2016  . CRI (chronic renal insufficiency) 05/03/2015  . Alzheimer's disease (HCC) 08/31/2014  . Agitation 08/31/2014  . Dementia (HCC) 08/31/2014  . Onychomycosis 01/28/2014  . 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 Nonspecific abnormal results of cardiovascular function study 03/02/2013  . Shortness of breath 03/02/2013  . Essential hypertension, benign 01/08/2013    Past Medical History:  Diagnosis Date  . Alzheimer disease   . CKD (chronic kidney disease), stage III (HCC)   . Colon polyp 2007  . Elevated PSA    4-7 in 2009  . Glaucoma   . Hernia, inguinal   . High cholesterol   . Hypercholesteremia   . Hyperlipemia   . Hypertension    grade 1 diastolic dysfunction, trace MR and TR, mild LVH Echo 3-/4    . Low back pain   . Macular degeneration   . Memory deficit   . Prostate disease   . Urinary incontinence     Family History  Problem Relation Age of Onset  . Dementia Mother   . Cancer Daughter   . Stroke Sister        32X2  . Dementia Sister   . Heart attack Neg Hx   . Hypertension Neg Hx    Past Surgical History:  Procedure Laterality Date  . GALLBLADDER SURGERY  1996   Social History   Social History Narrative   Patient is married with 4 children.   Patient is right handed.   Patient has college education.   Caffeine Use: 3 cups daily     Objective: Vital Signs: There were no vitals taken for this visit.   Physical Exam   Musculoskeletal Exam: ***  CDAI Exam: CDAI Score: Not documented Patient Global Assessment: Not documented; Provider Global Assessment: Not documented Swollen: Not documented; Tender: Not documented Joint Exam   Not documented   There is currently no information documented on the homunculus. Go to the Rheumatology activity and complete the homunculus joint exam.  Investigation: No additional findings.  Imaging: No results found.  Recent Labs: Lab Results  Component Value Date   WBC 11.3 (H) 05/02/2016   HGB 13.1 05/02/2016   PLT 213 05/02/2016   NA 140 05/02/2016   K 4.0 05/02/2016   CL 103 05/02/2016   CO2 29 05/02/2016   GLUCOSE 99 05/02/2016   BUN 25 (H) 05/02/2016   CREATININE 1.28 (H) 05/02/2016  BILITOT 0.5 04/10/2013   ALKPHOS 79 04/10/2013   AST 21 04/10/2013   ALT 14 04/10/2013   PROT 7.9 04/10/2013   ALBUMIN 3.9 04/10/2013   CALCIUM 8.6 (L) 05/02/2016   GFRAA 56 (L) 05/02/2016    Speciality Comments: No specialty comments available.  Procedures:  No procedures performed Allergies: Coreg [carvedilol] and Norvasc [amlodipine besylate]   Assessment / Plan:     Visit Diagnoses: No diagnosis found.   Orders: No orders of the defined types were placed in this encounter.  No orders of the defined types  were placed in this encounter.   Face-to-face time spent with patient was *** minutes. Greater than 50% of time was spent in counseling and coordination of care.  Follow-Up Instructions: No follow-ups on file.   Ellen Henri, CMA  Note - This record has been created using Animal nutritionist.  Chart creation errors have been sought, but may not always  have been located. Such creation errors do not reflect on  the standard of medical care.

## 2018-03-05 ENCOUNTER — Ambulatory Visit: Payer: Medicare Other | Admitting: Rheumatology

## 2018-06-09 ENCOUNTER — Ambulatory Visit: Payer: Medicare Other | Admitting: Rheumatology

## 2018-06-25 ENCOUNTER — Ambulatory Visit: Payer: Medicare Other | Admitting: Neurology

## 2018-06-29 ENCOUNTER — Other Ambulatory Visit: Payer: Self-pay

## 2018-06-29 NOTE — Telephone Encounter (Signed)
error 

## 2018-07-08 ENCOUNTER — Ambulatory Visit: Payer: Medicare Other | Admitting: Interventional Cardiology

## 2018-07-13 ENCOUNTER — Encounter: Payer: Self-pay | Admitting: Physician Assistant

## 2018-07-13 NOTE — Progress Notes (Signed)
Virtual Visit via Video Note   This visit type was conducted due to national recommendations for restrictions regarding the COVID-19 Pandemic (e.g. social distancing) in an effort to limit this patient's exposure and mitigate transmission in our community.  Due to his co-morbid illnesses, this patient is at least at moderate risk for complications without adequate follow up.  This format is felt to be most appropriate for this patient at this time.  All issues noted in this document were discussed and addressed.  A limited physical exam was performed with this format.  Please refer to the patient's chart for his consent to telehealth for [facility].   Date:  07/15/2018   ID:  David [name], DOB [birth year], MRN [id number]  Patient Location: Home Provider Location: Home  PCP:  Georgann Housekeeper, MD  Cardiologist:  Lance Muss, MD  Electrophysiologist:  None   Evaluation Performed:  Follow-Up Visit  Chief Complaint:  F/u presumed CAD, CHF  History of Present Illness:    David [name] is a 85 y.o. male with abnormal stress test (managed medically), chronic diastolic CHF, CKD stage III, Alzheimer's disease, HLD, HTN, macular degeneration, glaucoma, prostate disease, DVT 10/2016 on Eliquis (managed by PCP) who presents for routine follow-up.  He had a remote stress test in 2014 showing intermediate risk stress nuclear study with moderate-large area of ischemia in the inferior wall and apex, EF 53%.  2D echo 2014 showed mild LVH, EF 60-65%, mild MR, grade 1 DD. These have been managed medically given renal insufficiency, age and lack of accelerating symptoms. Last labs ([facility]) 03/2018 showed A1C 5.9, albumin 3.5, AST/ALT OK, Hgb 13, Hgb 38.8, K 3.9, Na 141, Cr 1.3, TSH wnl, Tchol 201, LDL 136.    He is accompanied by his caregiver and patient's wife who provide 100% of the history. They have not seen any significant changes this year symptom wise. He has not indicated any CP or SOB. No  significant edema. This tends to fluctuate, chronically R>L, and worse in heat - but not particularly an issue at present time. The patient does not have symptoms concerning for COVID-19 infection (fever, chills, cough, or new shortness of breath). PCP recently resumed amlodipine at low dose given elevated BPs at home.    Past Medical History:  Diagnosis Date  . Abnormal stress test    a. 2014 - managed medically.  . Alzheimer disease (HCC)   . Chronic diastolic CHF (congestive heart failure) (HCC)   . CKD (chronic kidney disease), stage III (HCC)   . Colon polyp 2007  . Elevated PSA    4-7 in 2009  . Glaucoma   . Hernia, inguinal   . Hypercholesteremia   . Hypertension    grade 1 diastolic dysfunction, trace MR and TR, mild LVH Echo 3-/4  . Low back pain   . Macular degeneration   . Memory deficit   . Prostate disease   . RBBB   . Urinary incontinence    Past Surgical History:  Procedure Laterality Date  . GALLBLADDER SURGERY  1996     Current Outpatient Medications:  .  amLODipine (NORVASC) 2.5 MG tablet, Take 2.5 mg by mouth daily., Disp: , Rfl:  .  apixaban (ELIQUIS) 5 MG TABS tablet, Take 2.5 mg by mouth 2 (two) times daily. , Disp: , Rfl:  .  B Complex-C (SUPER B COMPLEX PO), Take 1 tablet by mouth daily. , Disp: , Rfl:  .  Cholecalciferol (VITAMIN D-3) 1000 UNITS CAPS, Take 1,000  Units by mouth daily., Disp: , Rfl:  .  donepezil (ARICEPT) 10 MG tablet, TAKE ONE TABLET BY MOUTH AT BEDTIME, Disp: 90 tablet, Rfl: 3 .  fish oil-omega-3 fatty acids 1000 MG capsule, Take 1 g by mouth daily. , Disp: , Rfl:  .  folic acid (FOLVITE) 1 MG tablet, Take 1 mg by mouth daily., Disp: , Rfl:  .  furosemide (LASIX) 20 MG tablet, Take 20 mg by mouth as directed. Take 2 tablets ( ) by mouth in the morning and 1 tablet ( ) in the afternoon, Disp: , Rfl:  .  isosorbide mononitrate (IMDUR) 30 MG 24 hr tablet, Take 1 tablet (30 mg total) by mouth daily., Disp: 30 tablet, Rfl: 12 .   memantine (NAMENDA) 10 MG tablet, TAKE ONE TABLET BY MOUTH TWICE A DAY, Disp: 180 tablet, Rfl: 4 .  nitroGLYCERIN (NITROSTAT) 0.6 MG SL tablet, Place 1 tablet (0.6 mg total) under the tongue every 5 (five) minutes as needed for chest pain., Disp: 90 tablet, Rfl: 3 .  omeprazole (PRILOSEC) 20 MG capsule, Take 20 mg by mouth daily., Disp: , Rfl:  .  QUEtiapine (SEROQUEL) 25 MG tablet, TAKE ONE-HALF TABLET BY MOUTH TWO TIMES A DAY (Patient taking differently: Take 12.5 mg by mouth at bedtime. ), Disp: 60 tablet, Rfl: 6 .  tamsulosin (FLOMAX) 0.4 MG CAPS capsule, Take 0.8 mg by mouth every evening. , Disp: , Rfl:  .  acetaminophen (TYLENOL) 500 MG tablet, Take 1 tablet (500 mg total) by mouth every 6 (six) hours as needed for mild pain or moderate pain. (Patient not taking: Reported on 07/15/2018), Disp: 30 tablet, Rfl: 0 .  brimonidine (ALPHAGAN) 0.15 % ophthalmic solution, 1 drop 3 (three) times daily. As directed, Disp: , Rfl:  .  silver sulfADIAZINE (SILVADENE) 1 % cream, Apply 1 application topically daily., Disp: , Rfl:    Allergies:   Coreg [carvedilol] and Norvasc [amlodipine besylate]   Social History   Tobacco Use  . Smoking status: Never Smoker  . Smokeless tobacco: Never Used  Substance Use Topics  . Alcohol use: No    Alcohol/week: 0.0 standard drinks  . Drug use: No     Family Hx: The patient's family history includes Cancer in his daughter; Dementia in his mother and sister; Stroke in his sister. There is no history of Heart attack or Hypertension.  ROS:   Please see the history of present illness.    All other systems reviewed and are negative.   Prior CV studies:   The following studies were reviewed today:  Most recent pertinent cardiac studies are outlined above.   Labs/Other Tests and Data Reviewed:    EKG:  An ECG dated 07/03/17 was personally reviewed today and demonstrated:  NSR 85bpm, frequent PVCs, RBBB, cannot r/o inferior infarct   Wt Readings from Last  3 Encounters:  07/15/18 171 lb (77.6 kg)  07/03/17 176 lb 12.8 oz (80.2 kg)  07/01/17 172 lb (78 kg)     Objective:    Vital Signs:  BP (!) 141/80   Pulse 73   Wt 171 lb (77.6 kg)   BMI 26.00 kg/m    VITAL SIGNS:  reviewed  General - elderly smiling male in no acute distress HEENT - NCAT, EOM intact Pulm - No labored breathing, no coughing during visit, no audible wheezing Neuro - demented, does not really participate in conversation Psych - Pleasant affect    ASSESSMENT & PLAN:    1. History of abnormal stress  test and presumed CAD - no recent accelerating symptoms. He is not on ASA due to concomitant Eliquis (for DVT). Continue Imdur. 2. Chronic diastolic CHF - caregiver indicates volume status is stable. Patient appears comfortable and euvolemic on exam. Continue present regimen. They report he is now on 40mg  QAM / 20mg  QPM and deny needing refills. 3. Essential HTN - BP upper limits of normal but acceptable for age. They have been in communication with Dr. Eula ListenHussain about his BP recently, and will be monitoring it now that amlodipine has been resumed. I advised that if BP begins to run >140 regularly they may need to consider increasing to 5mg  daily but since his PCP has been primarily driving his care, it would make sense for this change to come through his office (so that there is no issue with med confusion). 4. Hyperlipidemia - followed by primary care. Not presently on a statin which Dr. Eldridge DaceVaranasi was aware of. Given his memory issues and comorbidities, long term benefit would be marginal.  COVID-19 Education: The signs and symptoms of COVID-19 were discussed with the patient and how to seek care for testing (follow up with PCP or arrange E-visit).  The importance of social distancing was discussed today.  Time:   Today, I have spent 16 minutes with the patient with telehealth technology discussing the above problems.     Medication Adjustments/Labs and Tests Ordered:  Current medicines are reviewed at length with the patient today.  Concerns regarding medicines are outlined above.   Disposition:  Follow up in 1 year with Dr. Eldridge DaceVaranasi  Signed, Laurann Montanaayna N Amaru Burroughs, PA-C  07/15/2018 10:54 AM    [facility]

## 2018-07-14 ENCOUNTER — Telehealth: Payer: Self-pay | Admitting: Physician Assistant

## 2018-07-14 ENCOUNTER — Encounter: Payer: Self-pay | Admitting: *Deleted

## 2018-07-14 NOTE — Telephone Encounter (Signed)
New message:   Please call patient concering appt. Patient has a Iphone. The number for that phone is [phone]

## 2018-07-14 NOTE — Telephone Encounter (Signed)
Spoke with pt's son, David [name], [place]. He has gave verbal consent for appt 07/15/2018.  I have sent email via mychart as well. Pt has alzheimer's and David [name] has gave permission to speak with pts wife for visit.     Virtual Visit Pre-Appointment Phone Call  "(Name), I am calling you today to discuss your upcoming appointment. We are currently trying to limit exposure to the virus that causes COVID-19 by seeing patients at home rather than in the office."  1. "What is the BEST phone number to call the day of the visit?" - include this in appointment notes  2. "Do you have or have access to (through a family member/friend) a smartphone with video capability that we can use for your visit?" a. If yes - list this number in appt notes as "cell" (if different from BEST phone #) and list the appointment type as a VIDEO visit in appointment notes b. If no - list the appointment type as a PHONE visit in appointment notes  3. Confirm consent - "In the setting of the current Covid19 crisis, you are scheduled for a (phone or video) visit with your provider on (date) at (time).  Just as we do with many in-office visits, in order for you to participate in this visit, we must obtain consent.  If you'd like, I can send this to your mychart (if signed up) or email for you to review.  Otherwise, I can obtain your verbal consent now.  All virtual visits are billed to your insurance company just like a normal visit would be.  By agreeing to a virtual visit, we'd like you to understand that the technology does not allow for your provider to perform an examination, and thus may limit your provider's ability to fully assess your condition. If your provider identifies any concerns that need to be evaluated in person, we will make arrangements to do so.  Finally, though the technology is pretty good, we cannot assure that it will always work on either your or our end, and in the setting of a video visit, we may have to convert it to a  phone-only visit.  In either situation, we cannot ensure that we have a secure connection.  Are you willing to proceed?" STAFF: Did the patient verbally acknowledge consent to telehealth visit? Document YES/NO here: YES BY SON, David [name], POA  4. Advise patient to be prepared - "Two hours prior to your appointment, go ahead and check your blood pressure, pulse, oxygen saturation, and your weight (if you have the equipment to check those) and write them all down. When your visit starts, your provider will ask you for this information. If you have an Apple Watch or Kardia device, please plan to have heart rate information ready on the day of your appointment. Please have a pen and paper handy nearby the day of the visit as well."  5. Give patient instructions for MyChart download to smartphone OR Doximity/Doxy.me as below if video visit (depending on what platform provider is using)  6. Inform patient they will receive a phone call 15 minutes prior to their appointment time (may be from unknown caller ID) so they should be prepared to answer    TELEPHONE CALL NOTE  David [name] has been deemed a candidate for a follow-up tele-health visit to limit community exposure during the Covid-19 pandemic. I spoke with the patient via phone to ensure availability of phone/video source, confirm preferred email & phone number, and discuss instructions and expectations.  I reminded David [name] to be prepared with any vital sign and/or heart rhythm information that could potentially be obtained via home monitoring, at the time of his visit. I reminded David [name] to expect a phone call prior to his visit.  Elliot Cousin, RMA 07/14/2018 12:10 PM   INSTRUCTIONS FOR DOWNLOADING THE MYCHART APP TO SMARTPHONE  - The patient must first make sure to have activated MyChart and know their login information - If Apple, go to [employer] and type in MyChart in the search bar and download the app. If Android, ask patient  to go to [employer] and type in [place] in the search bar and download the app. The app is free but as with any other app downloads, their phone may require them to verify saved payment information or Apple/Android password.  - The patient will need to then log into the app with their MyChart username and password, and select [facility] as their healthcare provider to link the account. When it is time for your visit, go to the MyChart app, find appointments, and click Begin Video Visit. Be sure to Select Allow for your device to access the Microphone and Camera for your visit. You will then be connected, and your provider will be with you shortly.  **If they have any issues connecting, or need assistance please contact MyChart service desk [phone])**  **If using a computer, in order to ensure the best quality for their visit they will need to use either of the following Internet Browsers: [employer] or Google Chrome**  IF USING DOXIMITY or DOXY.ME - The patient will receive a link just prior to their visit by text.     FULL LENGTH CONSENT FOR TELE-HEALTH VISIT   I hereby voluntarily request, consent and authorize [facility] and its employed or contracted physicians, physician assistants, nurse practitioners or other licensed health care professionals (the Practitioner), to provide me with telemedicine health care services (the "Services") as deemed necessary by the treating Practitioner. I acknowledge and consent to receive the Services by the Practitioner via telemedicine. I understand that the telemedicine visit will involve communicating with the Practitioner through live audiovisual communication technology and the disclosure of certain medical information by electronic transmission. I acknowledge that I have been given the opportunity to request an in-person assessment or other available alternative prior to the telemedicine visit and am voluntarily  participating in the telemedicine visit.  I understand that I have the right to withhold or withdraw my consent to the use of telemedicine in the course of my care at any time, without affecting my right to future care or treatment, and that the Practitioner or I may terminate the telemedicine visit at any time. I understand that I have the right to inspect all information obtained and/or recorded in the course of the telemedicine visit and may receive copies of available information for a reasonable fee.  I understand that some of the potential risks of receiving the Services via telemedicine include:  Marland Kitchen Delay or interruption in medical evaluation due to technological equipment failure or disruption; . Information transmitted may not be sufficient (e.g. poor resolution of images) to allow for appropriate medical decision making by the Practitioner; and/or  . In rare instances, security protocols could fail, causing a breach of personal health information.  Furthermore, I acknowledge that it is my responsibility to provide information about my medical history, conditions and care that is complete and accurate to the best of my ability.  I acknowledge that Practitioner's advice, recommendations, and/or decision may be based on factors not within their control, such as incomplete or inaccurate data provided by me or distortions of diagnostic images or specimens that may result from electronic transmissions. I understand that the practice of medicine is not an exact science and that Practitioner makes no warranties or guarantees regarding treatment outcomes. I acknowledge that I will receive a copy of this consent concurrently upon execution via email to the email address I last provided but may also request a printed copy by calling the office of [facility].    I understand that my insurance will be billed for this visit.   I have read or had this consent read to me. . I understand the contents of this  consent, which adequately explains the benefits and risks of the Services being provided via telemedicine.  . I have been provided ample opportunity to ask questions regarding this consent and the Services and have had my questions answered to my satisfaction. . I give my informed consent for the services to be provided through the use of telemedicine in my medical care  By participating in this telemedicine visit I agree to the above.

## 2018-07-15 ENCOUNTER — Other Ambulatory Visit: Payer: Self-pay

## 2018-07-15 ENCOUNTER — Encounter: Payer: Self-pay | Admitting: Physician Assistant

## 2018-07-15 ENCOUNTER — Telehealth (INDEPENDENT_AMBULATORY_CARE_PROVIDER_SITE_OTHER): Payer: Medicare Other | Admitting: Physician Assistant

## 2018-07-15 VITALS — BP 141/80 | HR 73 | Wt 171.0 lb

## 2018-07-15 DIAGNOSIS — R9439 Abnormal result of other cardiovascular function study: Secondary | ICD-10-CM

## 2018-07-15 DIAGNOSIS — E785 Hyperlipidemia, unspecified: Secondary | ICD-10-CM

## 2018-07-15 DIAGNOSIS — I10 Essential (primary) hypertension: Secondary | ICD-10-CM

## 2018-07-15 DIAGNOSIS — I5032 Chronic diastolic (congestive) heart failure: Secondary | ICD-10-CM

## 2018-07-15 NOTE — Patient Instructions (Signed)
Medication Instructions:  Your physician recommends that you continue on your current medications as directed. Please refer to the Current Medication list given to you today.  If you need a refill on your cardiac medications before your next appointment, please call your pharmacy.   Lab work: None ordered  If you have labs (blood work) drawn today and your tests are completely normal, you will receive your results only by: . MyChart Message (if you have MyChart) OR . A paper copy in the mail If you have any lab test that is abnormal or we need to change your treatment, we will call you to review the results.  Testing/Procedures: None ordered  Follow-Up: At [facility], you and your health needs are our priority.  As part of our continuing mission to provide you with exceptional heart care, we have created designated Provider Care Teams.  These Care Teams include your primary Cardiologist (physician) and Advanced Practice Providers (APPs -  Physician Assistants and Nurse Practitioners) who all work together to provide you with the care you need, when you need it. You will need a follow up appointment in 12 months.  Please call our office 2 months in advance to schedule this appointment.  You may see Jayadeep Varanasi, MD or one of the following Advanced Practice Providers on your designated Care Team:   Brittainy Simmons, PA-C Dayna Dunn, PA-C . Michele Lenze, PA-C  Any Other Special Instructions Will Be Listed Below (If Applicable).    

## 2018-07-16 ENCOUNTER — Telehealth: Payer: Self-pay

## 2018-07-16 NOTE — Progress Notes (Signed)
Office Visit Note  Patient: David [name]             Date of Birth: [birth year]           MRN: [id number]             PCP: Georgann HousekeeperHusain, Karrar, MD Referring: Georgann HousekeeperHusain, Karrar, MD Visit Date: 07/30/2018 Occupation: @GUAROCC @  Subjective:  Pain in both knee joints   History of Present Illness: David [name] is a 85 y.o. male accompanied by his wife and caregiver.  According to his wife he has been having difficulty getting up from the chair and doing routine activities.  He will see his knees and she believes that she is having increased pain and discomfort.  He usually responds very well to cortisone injection.  Joints are painful or swollen.  Activities of Daily Living:  Patient reports morning stiffness for  unknown.   Patient Denies nocturnal pain.  Difficulty dressing/grooming: Reports Difficulty climbing stairs: Reports Difficulty getting out of chair: Reports Difficulty using hands for taps, buttons, cutlery, and/or writing: Reports  Review of Systems  Constitutional: Negative for fatigue and night sweats.  HENT: Negative for mouth sores, mouth dryness and nose dryness.   Eyes: Negative for redness and dryness.  Respiratory: Negative for shortness of breath and difficulty breathing.   Cardiovascular: Negative for chest pain, palpitations, hypertension, irregular heartbeat and swelling in legs/feet.  Gastrointestinal: Negative for constipation and diarrhea.  Endocrine: Negative for increased urination.  Musculoskeletal: Negative for arthralgias, joint pain, joint swelling, myalgias, muscle weakness, morning stiffness, muscle tenderness and myalgias.  Skin: Negative for color change, rash, hair loss, nodules/bumps, skin tightness, ulcers and sensitivity to sunlight.  Allergic/Immunologic: Negative for susceptible to infections.  Neurological: Negative for dizziness, fainting, memory loss, night sweats and weakness ( ).  Hematological: Negative for swollen glands.   Psychiatric/Behavioral: Negative for depressed mood and sleep disturbance. The patient is not nervous/anxious.     PMFS History:  Patient Active Problem List   Diagnosis Date Noted  . Primary osteoarthritis of both knees 10/15/2016  . Dyslipidemia 07/16/2016  . History of glaucoma 07/16/2016  . Bilateral lower extremity edema 06/06/2016  . Chronic pain of both knees 04/17/2016  . Chronic left shoulder pain 04/17/2016  . CRI (chronic renal insufficiency) 05/03/2015  . Alzheimer's disease (HCC) 08/31/2014  . Agitation 08/31/2014  . Dementia (HCC) 08/31/2014  . Onychomycosis 01/28/2014  . 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 Nonspecific abnormal results of cardiovascular function study 03/02/2013  . Shortness of breath 03/02/2013  . Essential hypertension, benign 01/08/2013    Past Medical History:  Diagnosis Date  . Abnormal stress test    a. 2014 - managed medically.  . Alzheimer disease (HCC)   . Chronic diastolic CHF (congestive heart failure) (HCC)   . CKD (chronic kidney disease), stage III (HCC)   . Colon polyp 2007  . Elevated PSA    4-7 in 2009  . Glaucoma   . Hernia, inguinal   . Hypercholesteremia   . Hypertension    grade 1 diastolic dysfunction, trace MR and TR, mild LVH Echo 3-/4  . Low back pain   . Macular degeneration   . Memory deficit   . Prostate disease   . RBBB   . Urinary incontinence     Family History  Problem Relation Age of Onset  . Dementia Mother   . Cancer Daughter   . Stroke Sister        39X2  . Dementia  Sister   . Heart attack Neg Hx   . Hypertension Neg Hx    Past Surgical History:  Procedure Laterality Date  . GALLBLADDER SURGERY  1996   Social History   Social History Narrative   Patient is married with 4 children.   Patient is right handed.   Patient has college education.   Caffeine Use: 3 cups daily     There is no immunization history on file for this patient.   Objective: Vital Signs: BP (!)  141/66 (BP Location: Right Arm, Patient Position: Sitting, Cuff Size: Normal)   Pulse (!) 58   Resp 18    Physical Exam Vitals signs and nursing note reviewed.  Constitutional:      Appearance: He is well-developed.  HENT:     Head: Normocephalic and atraumatic.  Eyes:     Conjunctiva/sclera: Conjunctivae normal.     Pupils: Pupils are equal, round, and reactive to light.  Neck:     Musculoskeletal: Normal range of motion and neck supple.  Cardiovascular:     Rate and Rhythm: Normal rate and regular rhythm.     Heart sounds: Normal heart sounds.  Pulmonary:     Effort: Pulmonary effort is normal.     Breath sounds: Normal breath sounds.  Abdominal:     General: Bowel sounds are normal.     Palpations: Abdomen is soft.  Skin:    General: Skin is warm and dry.     Capillary Refill: Capillary refill takes less than 2 seconds.  Neurological:     Mental Status: He is alert.     Comments: Patient has Alzheimer's and has flat affect.  He is wheelchair-bound.  Psychiatric:        Behavior: Behavior normal.      Musculoskeletal Exam: Patient is wheelchair-bound.  He had no arthritis in his hands.  He does not follow commands very well.  He had no warmth swelling or effusion in his knee joints.  CDAI Exam: CDAI Score: - Patient Global: -; Provider Global: - Swollen: -; Tender: - Joint Exam   No joint exam has been documented for this visit   There is currently no information documented on the homunculus. Go to the Rheumatology activity and complete the homunculus joint exam.  Investigation: No additional findings.  Imaging: No results found.  Recent Labs: Lab Results  Component Value Date   WBC 11.3 (H) 05/02/2016   HGB 13.1 05/02/2016   PLT 213 05/02/2016   NA 140 05/02/2016   K 4.0 05/02/2016   CL 103 05/02/2016   CO2 29 05/02/2016   GLUCOSE 99 05/02/2016   BUN 25 (H) 05/02/2016   CREATININE 1.28 (H) 05/02/2016   BILITOT 0.5 04/10/2013   ALKPHOS 79  04/10/2013   AST 21 04/10/2013   ALT 14 04/10/2013   PROT 7.9 04/10/2013   ALBUMIN 3.9 04/10/2013   CALCIUM 8.6 (L) 05/02/2016   GFRAA 56 (L) 05/02/2016    Speciality Comments: No specialty comments available.  Procedures:  Large Joint Inj: bilateral knee on 07/30/2018 12:17 PM Indications: pain Details: 27 G 1.5 in needle, medial approach  Arthrogram: No  Medications (Right): 1.5 mL lidocaine 1 %; 40 mg triamcinolone acetonide 40 MG/ML Aspirate (Right): 0 mL Medications (Left): 1.5 mL lidocaine 1 %; 40 mg triamcinolone acetonide 40 MG/ML Aspirate (Left): 0 mL Outcome: tolerated well, no immediate complications Procedure, treatment alternatives, risks and benefits explained, specific risks discussed. Consent was given by the patient. Immediately prior to procedure  a time out was called to verify the correct patient, procedure, equipment, support staff and site/side marked as required. Patient was prepped and draped in the usual sterile fashion.     Allergies: Coreg [carvedilol] and Norvasc [amlodipine besylate]   Assessment / Plan:     Visit Diagnoses: Primary osteoarthritis of both knees -patient has pain and discomfort in his knee joints.  He does not express his problems due to Alzheimer's.  Per request of patient's wife bilateral knee joints were injected with cortisone as described above.  He tolerated the procedure well.    Chronic pain of both knees   Adhesive capsulitis of left shoulder -he has limited range of motion but he does not complain.  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 Large Joint Inj: bilateral knee   No orders of the defined types were placed in this encounter.   Face-to-face time spent with patient was 15 minutes. Greater than 50% of time was spent in counseling and coordination of care.   Follow-Up Instructions: Return if symptoms worsen or fail to improve, for Osteoarthritis.   Pollyann Savoy, MD  Note - This record has been created using Animal nutritionist.  Chart creation errors have been sought, but may not always  have been located. Such creation errors do not reflect on  the standard of medical care.

## 2018-07-16 NOTE — Telephone Encounter (Signed)
I called pts son Ajay that pts appt will be change to video due to COVID 19. I also explain pt is high risk this time for in office visit. THe son gave me verbal consent to do video visit and to file insurance. THe son number is [phone],and [employer] is his cell carrier. I explain link will be text day before. He will click link and type pts first and last name. Ajay verbalized understanding.

## 2018-07-21 NOTE — Telephone Encounter (Signed)
Link text to sons cell phone at 500pm today.

## 2018-07-22 ENCOUNTER — Other Ambulatory Visit: Payer: Self-pay

## 2018-07-22 ENCOUNTER — Ambulatory Visit (INDEPENDENT_AMBULATORY_CARE_PROVIDER_SITE_OTHER): Payer: Medicare Other | Admitting: Neurology

## 2018-07-22 DIAGNOSIS — F0280 Dementia in other diseases classified elsewhere without behavioral disturbance: Secondary | ICD-10-CM

## 2018-07-22 DIAGNOSIS — G301 Alzheimer's disease with late onset: Secondary | ICD-10-CM

## 2018-07-22 MED ORDER — MEMANTINE HCL 10 MG PO TABS: 10.0000 mg | ORAL_TABLET | Freq: Two times a day (BID) | ORAL | 4 refills | Status: AC

## 2018-07-22 MED ORDER — DONEPEZIL HCL 10 MG PO TABS: 10.0000 mg | ORAL_TABLET | Freq: Every day | ORAL | 3 refills | Status: AC

## 2018-07-22 NOTE — Progress Notes (Signed)
Virtual Visit via Video Note  I connected with David [name] on 07/22/18 at 11:30 AM EDT by a video enabled telemedicine application and verified that I am speaking with the correct person using two identifiers. This message Location: Patient:  At his home Provider: at [facility] office  This visit was performed using [email or web address] app for audio and video.  The patient's son David [name] was present throughout this visit and facilitated it. I discussed the limitations of evaluation and management by telemedicine and the availability of in person appointments. The patient expressed understanding and agreed to proceed.  History of Present Illness: Mr. David [name] is seen for virtual video follow-up visit following last visit on 06/12/2017.  History is provided by his son.  Patient continues to have advanced dementia and speaks only few words and is mostly nonverbal.  He has been pleasant and has had no behavioral issues.  He has a set routine and has 2 daytime caregivers.  He needs some help to get up but can walk by himself.  He needs to be taken to the restroom every few hours.  He needs help with all activities of daily living.  He remains on Aricept 10 mg daily and Namenda 10 mg twice daily.  Is tolerating both medications well.  He had some recent issues with elevated blood pressure and saw his cardiologist who added low-dose Norvasc.  He does not go much out of the house.  There have been no hallucinations or delusions or unsafe behavior noted.   Observations/Objective: Physical and neurological exam is limited due to constraints of virtual video visit.  Pleasant elderly [place] male not in distress.  Is awake alert.  Is mostly mute and can speak only a few words and short sentences.  Can follow simple midline and one-step commands.  Face is symmetric.  Tongue midline.  Eye movements appear full range.  Gait was not tested.  Assessment and Plan: 85 year old male with advanced Alzheimer's dementia who  appears stable and requires total care which the family has been able to provide.  No new issues. Plan I had a long discussion with the patient and his son regarding his advanced Alzheimer's dementia and lack of any more effective treatment options at this time.  Continue Aricept 10 mg daily and Namenda 10 mg twice daily and the current dosages as he has not been able to tolerate higher doses in the past.  Continue 24-hour caregiver support which he presently has and seems to be working well.  He was given a refill for his Aricept and Namenda for a year.  He will return for follow-up in a year or call earlier if necessary. Follow Up Instructions: Return for follow-up in a year or call earlier if needed   I discussed the assessment and treatment plan with the patient. The patient was provided an opportunity to ask questions and all were answered. The patient agreed with the plan and demonstrated an understanding of the instructions.   The patient was advised to call back or seek an in-person evaluation if the symptoms worsen or if the condition fails to improve as anticipated.  I provided 25 minutes of non-face-to-face time during this encounter.   Delia Heady, MD

## 2018-07-28 ENCOUNTER — Other Ambulatory Visit: Payer: Self-pay | Admitting: Interventional Cardiology

## 2018-07-28 MED ORDER — ISOSORBIDE MONONITRATE ER 30 MG PO TB24: 30.0000 mg | ORAL_TABLET | Freq: Every day | ORAL | 3 refills | Status: DC

## 2018-07-30 ENCOUNTER — Ambulatory Visit (INDEPENDENT_AMBULATORY_CARE_PROVIDER_SITE_OTHER): Payer: Medicare Other | Admitting: Rheumatology

## 2018-07-30 ENCOUNTER — Encounter: Payer: Self-pay | Admitting: Rheumatology

## 2018-07-30 ENCOUNTER — Other Ambulatory Visit: Payer: Self-pay

## 2018-07-30 VITALS — BP 141/66 | HR 58 | Resp 18

## 2018-07-30 DIAGNOSIS — M25561 Pain in right knee: Secondary | ICD-10-CM | POA: Diagnosis not present

## 2018-07-30 DIAGNOSIS — M170 Bilateral primary osteoarthritis of knee: Secondary | ICD-10-CM

## 2018-07-30 DIAGNOSIS — M7502 Adhesive capsulitis of left shoulder: Secondary | ICD-10-CM | POA: Diagnosis not present

## 2018-07-30 DIAGNOSIS — Z8669 Personal history of other diseases of the nervous system and sense organs: Secondary | ICD-10-CM

## 2018-07-30 DIAGNOSIS — N183 Chronic kidney disease, stage 3 unspecified: Secondary | ICD-10-CM

## 2018-07-30 DIAGNOSIS — E785 Hyperlipidemia, unspecified: Secondary | ICD-10-CM | POA: Diagnosis not present

## 2018-07-30 DIAGNOSIS — M25562 Pain in left knee: Secondary | ICD-10-CM

## 2018-07-30 DIAGNOSIS — G309 Alzheimer's disease, unspecified: Secondary | ICD-10-CM | POA: Diagnosis not present

## 2018-07-30 DIAGNOSIS — G8929 Other chronic pain: Secondary | ICD-10-CM | POA: Diagnosis not present

## 2018-07-30 DIAGNOSIS — F0280 Dementia in other diseases classified elsewhere without behavioral disturbance: Secondary | ICD-10-CM

## 2018-07-30 MED ORDER — LIDOCAINE HCL 1 % IJ SOLN
1.5000 mL | INTRAMUSCULAR | Status: AC | PRN
  Administered 2018-07-30: 1.5 mL

## 2018-07-30 MED ORDER — TRIAMCINOLONE ACETONIDE 40 MG/ML IJ SUSP
40.0000 mg | INTRAMUSCULAR | Status: AC | PRN
  Administered 2018-07-30: 40 mg via INTRA_ARTICULAR

## 2018-08-20 ENCOUNTER — Emergency Department (HOSPITAL_COMMUNITY)
Admission: EM | Admit: 2018-08-20 | Discharge: 2018-08-20 | Disposition: A | Discharge status: Home / Self Care | Payer: Medicare Other | Attending: Emergency Medicine | Admitting: Emergency Medicine

## 2018-08-20 ENCOUNTER — Other Ambulatory Visit: Payer: Self-pay

## 2018-08-20 DIAGNOSIS — G309 Alzheimer's disease, unspecified: Secondary | ICD-10-CM | POA: Diagnosis not present

## 2018-08-20 DIAGNOSIS — N183 Chronic kidney disease, stage 3 (moderate): Secondary | ICD-10-CM | POA: Insufficient documentation

## 2018-08-20 DIAGNOSIS — I5032 Chronic diastolic (congestive) heart failure: Secondary | ICD-10-CM | POA: Diagnosis not present

## 2018-08-20 DIAGNOSIS — Z79899 Other long term (current) drug therapy: Secondary | ICD-10-CM | POA: Diagnosis not present

## 2018-08-20 DIAGNOSIS — L0291 Cutaneous abscess, unspecified: Secondary | ICD-10-CM

## 2018-08-20 DIAGNOSIS — I130 Hypertensive heart and chronic kidney disease with heart failure and stage 1 through stage 4 chronic kidney disease, or unspecified chronic kidney disease: Secondary | ICD-10-CM | POA: Insufficient documentation

## 2018-08-20 DIAGNOSIS — L02415 Cutaneous abscess of right lower limb: Secondary | ICD-10-CM | POA: Diagnosis not present

## 2018-08-20 DIAGNOSIS — Z7901 Long term (current) use of anticoagulants: Secondary | ICD-10-CM | POA: Diagnosis not present

## 2018-08-20 MED ORDER — LIDOCAINE-EPINEPHRINE (PF) 2 %-1:200000 IJ SOLN
10.0000 mL | Freq: Once | INTRAMUSCULAR | Status: AC
  Administered 2018-08-20: 10 mL
  Filled 2018-08-20: qty 20

## 2018-08-20 MED ORDER — CLINDAMYCIN HCL 300 MG PO CAPS: 300.0000 mg | ORAL_CAPSULE | Freq: Three times a day (TID) | ORAL | 0 refills | Status: DC

## 2018-08-20 MED ORDER — CLINDAMYCIN HCL 150 MG PO CAPS
300.0000 mg | ORAL_CAPSULE | Freq: Once | ORAL | Status: AC
  Administered 2018-08-20: 300 mg via ORAL
  Filled 2018-08-20: qty 2

## 2018-08-20 NOTE — ED Provider Notes (Addendum)
[facility] Provider Note   CSN: [id number] Arrival date & time: 08/20/18  1221    History   Chief Complaint Chief Complaint  Patient presents with  . abcess    HPI David DamesSatya [name] is a 85 y.o. male.     Patient is 85 year old male who presents with an abscess.  He has an abscess on his proximal right thigh.  He has a dementia so his history is limited but his son states that the home health nurse noticed it a couple of days ago and it is gotten bigger and started to drain since then.  He is otherwise been at his baseline mental status.  No known fevers.  No history of similar abscesses in the past.  Of note, he is on Eliquis for bilateral DVTs that was diagnosed in 2018.  He did take his morning dose today.  The abscess has been bleeding a little bit.  He was seen by his PCP and sent her for probable I&D.  The patient lives at home with his son with a daily home health aide that visits.     Past Medical History:  Diagnosis Date  . Abnormal stress test    a. 2014 - managed medically.  . Alzheimer disease (HCC)   . Chronic diastolic CHF (congestive heart failure) (HCC)   . CKD (chronic kidney disease), stage III (HCC)   . Colon polyp 2007  . Elevated PSA    4-7 in 2009  . Glaucoma   . Hernia, inguinal   . Hypercholesteremia   . Hypertension    grade 1 diastolic dysfunction, trace MR and TR, mild LVH Echo 3-/4  . Low back pain   . Macular degeneration   . Memory deficit   . Prostate disease   . RBBB   . Urinary incontinence     Patient Active Problem List   Diagnosis Date Noted  . Primary osteoarthritis of both knees 10/15/2016  . Dyslipidemia 07/16/2016  . History of glaucoma 07/16/2016  . Bilateral lower extremity edema 06/06/2016  . Chronic pain of both knees 04/17/2016  . Chronic left shoulder pain 04/17/2016  . CRI (chronic renal insufficiency) 05/03/2015  . Alzheimer's disease (HCC) 08/31/2014  . Agitation 08/31/2014   . Dementia (HCC) 08/31/2014  . Onychomycosis 01/28/2014  . 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 Nonspecific abnormal results of cardiovascular function study 03/02/2013  . Shortness of breath 03/02/2013  . Essential hypertension, benign 01/08/2013    Past Surgical History:  Procedure Laterality Date  . GALLBLADDER SURGERY  1996        Home Medications    Prior to Admission medications   Medication Sig Start Date End Date Taking? Authorizing Provider  acetaminophen (TYLENOL) 500 MG tablet Take 1 tablet (500 mg total) by mouth every 6 (six) hours as needed for mild pain or moderate pain. Patient not taking: Reported on 07/30/2018 05/02/16   Fayrene Helperran, Bow[place], PA-C  amLODipine (NORVASC) 2.5 MG tablet Take 2.5 mg by mouth daily. 07/06/18   [provider]  apixaban (ELIQUIS) 5 MG TABS tablet Take 2.5 mg by mouth 2 (two) times daily.     [provider]  B Complex-C (SUPER B COMPLEX PO) Take 1 tablet by mouth daily.     [provider]  brimonidine (ALPHAGAN) 0.15 % ophthalmic solution 1 drop 3 (three) times daily. As directed    [provider]  Cholecalciferol (VITAMIN D-3) 1000 UNITS CAPS Take 1,000 Units by  mouth daily.    [provider]  clindamycin (CLEOCIN) 300 MG capsule Take 1 capsule (300 mg total) by mouth 3 (three) times daily. X 7 days 08/20/18   Rolan BuccoBelfi, Anitra Doxtater, MD  donepezil (ARICEPT) 10 MG tablet Take 1 tablet (10 mg total) by mouth at bedtime. 07/22/18   Micki RileySethi, Pramod S, MD  fish oil-omega-3 fatty acids 1000 MG capsule Take 1 g by mouth daily.     [provider]  folic acid (FOLVITE) 1 MG tablet Take 1 mg by mouth daily.    [provider]  furosemide (LASIX) 20 MG tablet Take 20 mg by mouth as directed. Take 2 tablets (40mg ) by mouth in the morning and 1 tablet (20mg ) in the afternoon    [provider]  isosorbide mononitrate (IMDUR) 30 MG 24 hr tablet Take 1 tablet (30 mg total) by mouth daily.  07/28/18   Corky CraftsVaranasi, Jayadeep S, MD  memantine (NAMENDA) 10 MG tablet Take 1 tablet (10 mg total) by mouth 2 (two) times daily. 07/22/18   Micki RileySethi, Pramod S, MD  nitroGLYCERIN (NITROSTAT) 0.6 MG SL tablet Place 1 tablet (0.6 mg total) under the tongue every 5 (five) minutes as needed for chest pain. Patient not taking: Reported on 07/30/2018 07/03/17   Corky CraftsVaranasi, Jayadeep S, MD  omeprazole (PRILOSEC) 20 MG capsule Take 20 mg by mouth daily.    [provider]  QUEtiapine (SEROQUEL) 25 MG tablet TAKE ONE-HALF TABLET BY MOUTH TWO TIMES A DAY Patient taking differently: Take 12.5 mg by mouth at bedtime.  11/26/17   Micki RileySethi, Pramod S, MD  silver sulfADIAZINE (SILVADENE) 1 % cream Apply 1 application topically daily.    [provider]  tamsulosin (FLOMAX) 0.4 MG CAPS capsule Take 0.8 mg by mouth every evening.     [provider]    Family History Family History  Problem Relation Age of Onset  . Dementia Mother   . Cancer Daughter   . Stroke Sister        [id number]  . Dementia Sister   . Heart attack Neg Hx   . Hypertension Neg Hx     Social History Social History   Tobacco Use  . Smoking status: Never Smoker  . Smokeless tobacco: Never Used  Substance Use Topics  . Alcohol use: No    Alcohol/week: 0.0 standard drinks  . Drug use: No     Allergies   Coreg [carvedilol] and Norvasc [amlodipine besylate]   Review of Systems Review of Systems  Unable to perform ROS: Dementia     Physical Exam Updated Vital Signs BP (!) 137/50   Pulse 65   Temp 97.8 F (36.6 C) (Axillary)   Resp 17   SpO2 98%   Physical Exam Constitutional:      Appearance: He is well-developed.  HENT:     Head: Normocephalic and atraumatic.  Eyes:     Pupils: Pupils are equal, round, and reactive to light.  Neck:     Musculoskeletal: Normal range of motion and neck supple.  Cardiovascular:     Rate and Rhythm: Normal rate and regular rhythm.     Heart sounds: Normal heart sounds.   Pulmonary:     Effort: Pulmonary effort is normal. No respiratory distress.     Breath sounds: Normal breath sounds. No wheezing or rales.  Chest:     Chest wall: No tenderness.  Abdominal:     General: Bowel sounds are normal.     Palpations: Abdomen is  soft.     Tenderness: There is no abdominal tenderness. There is no guarding or rebound.  Genitourinary:    Comments: nontender right inguinal hernia,mobile Musculoskeletal: Normal range of motion.  Lymphadenopathy:     Cervical: No cervical adenopathy.  Skin:    General: Skin is warm and dry.     Findings: No rash.     Comments: Patient has a 3 cm fluctuant abscess to his proximal right thigh.  There is a mild amount of surrounding erythema.  There is a small amount of bloody, purulent drainage.  Neurological:     General: No focal deficit present.     Mental Status: He is alert.     Comments: Patient is awake and alert.  He is confused at baseline.      ED Treatments / Results  Labs (all labs ordered are listed, but only abnormal results are displayed) Labs Reviewed - No data to display  EKG None  Radiology No results found.  Procedures .Marland Kitchen.Incision and Drainage  Date/Time: 08/20/2018 1:09 PM Performed by: Rolan BuccoBelfi, Nicolaas Savo, MD Authorized by: Rolan BuccoBelfi, Luvern Mischke, MD   Consent:    Consent obtained:  Verbal   Consent given by:  Healthcare agent   Risks discussed:  Bleeding, incomplete drainage, pain and infection   Alternatives discussed:  No treatment Location:    Type:  Abscess   Size:  2cm   Location:  Lower extremity   Lower extremity location: right thigh. Pre-procedure details:    Skin preparation:  Betadine Anesthesia (see MAR for exact dosages):    Anesthesia method:  Local infiltration   Local anesthetic:  Lidocaine 2% WITH epi Procedure type:    Complexity:  Simple Procedure details:    Incision types:  Elliptical   Incision depth:  Dermal   Scalpel blade:  11   Wound management:  Probed and deloculated    Drainage:  Bloody   Drainage amount:  Scant   Wound treatment:  Wound left open   Packing materials:  None Post-procedure details:    Patient tolerance of procedure:  Tolerated well, no immediate complications   (including critical care time)  Medications Ordered in ED Medications  clindamycin (CLEOCIN) capsule 300 mg (has no administration in time range)  lidocaine-EPINEPHrine (XYLOCAINE W/EPI) 2 %-1:200000 (PF) injection 10 mL (10 mLs Infiltration Given by Other 08/20/18 1305)     Initial Impression / Assessment and Plan / ED Course  I have reviewed the triage vital signs and the nursing notes.  Pertinent labs & imaging results that were available during my care of the patient were reviewed by me and considered in my medical decision making (see chart for details).        Patient presents with a 2 cm fluctuant abscess to his proximal right thigh.  There is a small amount of surrounding erythema.  The wound was opened with a small amount of drainage.  I did attempt to deloculated the area and left the wound open.  There is no significant ongoing bleeding.  The wound was dressed.  His son who is the caregiver was given wound care instructions.  I will start him on clindamycin.  He was advised to follow-up with his PCP if it does not seem to be improving in the next few days or return here as needed if there is any worsening symptoms.  Final Clinical Impressions(s) / ED Diagnoses   Final diagnoses:  Abscess    ED Discharge Orders  Ordered    clindamycin (CLEOCIN) 300 MG capsule  3 times daily     08/20/18 1307           Malvin Johns, MD 08/20/18 1311    Malvin Johns, MD 08/20/18 1326

## 2018-08-20 NOTE — ED Triage Notes (Signed)
Pt brought in by ems for c/o abscess to the right groin area that has been irritated by his depends ;drainage noted to the area ; pt non verbal and is at baseline

## 2018-08-20 NOTE — ED Notes (Signed)
Patient verbalizes understanding of discharge instructions. Opportunity for questioning and answering were provided. Armband removed by staff , patient discharged from ED. Son and caretaker here to take patient home

## 2018-08-20 NOTE — Discharge Instructions (Addendum)
Use warm compresses to the area several times a day.  Change the dressing at least once daily.  Take the full course of antibiotics.  Return to the emergency department if the area enlarges, becomes more painful, any fevers develop, or redness starts developing or enlarging around the area.  Follow-up with your primary care physician in the next few days for recheck if not significantly better.  Do not take your evening dose of Eliquis tonight, but you may resume taking Eliquis tomorrow.

## 2018-11-04 IMAGING — CT CT CERVICAL SPINE W/O CM
4 of 11 series · 8 of 33 positions shown, 9 images · non-contrast
Comparison: None.

CLINICAL DATA: Pain following fall

EXAM:
CT HEAD WITHOUT CONTRAST
CT MAXILLOFACIAL WITHOUT CONTRAST
CT CERVICAL SPINE WITHOUT CONTRAST
TECHNIQUE: Multidetector CT imaging of the head, cervical spine, and
maxillofacial structures were performed using the standard protocol
without intravenous contrast. Multiplanar CT image reconstructions
of the cervical spine and maxillofacial structures were also
generated.

[Series 303: coronal std, idose (1) · coronal · 0.34mm/px · 1 of 89 slices shown]
[im 45/89  bone]
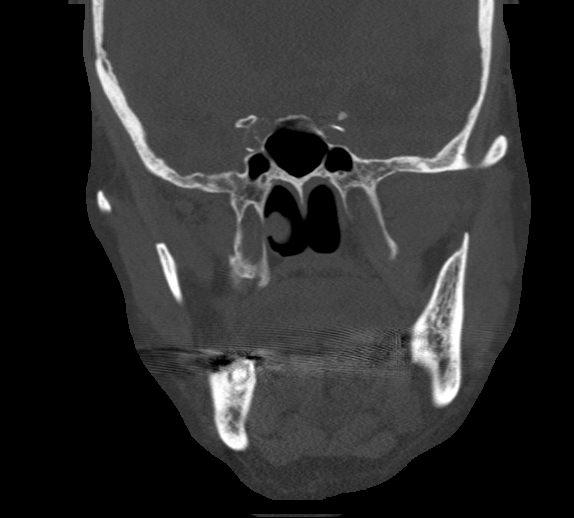

[Series 402: soft tissue, idose (2) · axial · 0.36mm/px · z∈[+819,+891]mm · 2 of 108 slices shown]
[im 36/108  soft-tissue]
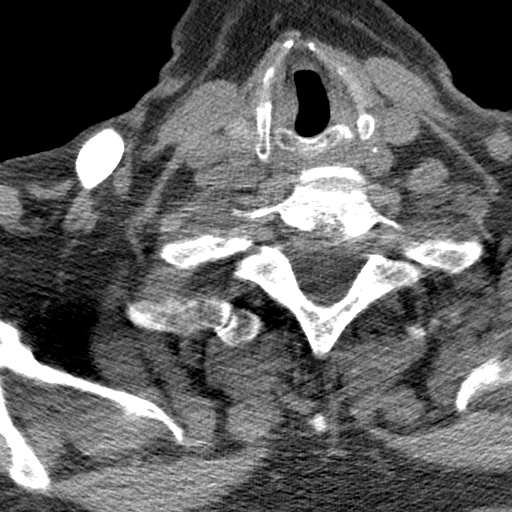
[im 72/108  soft-tissue]
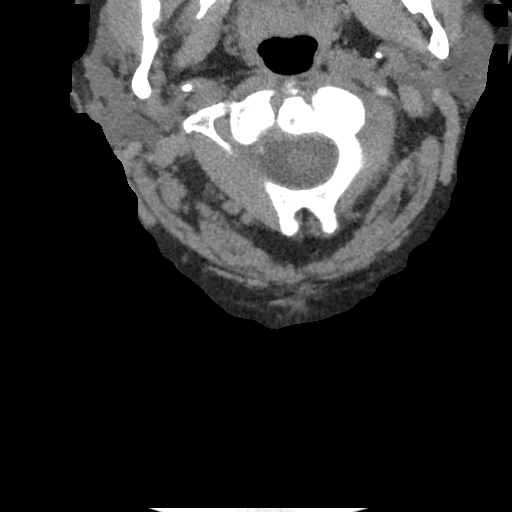

[Series 405: sagittal, idose (2) · sagittal · 0.34mm/px · 3 of 91 slices shown]
[im 23/91  bone]
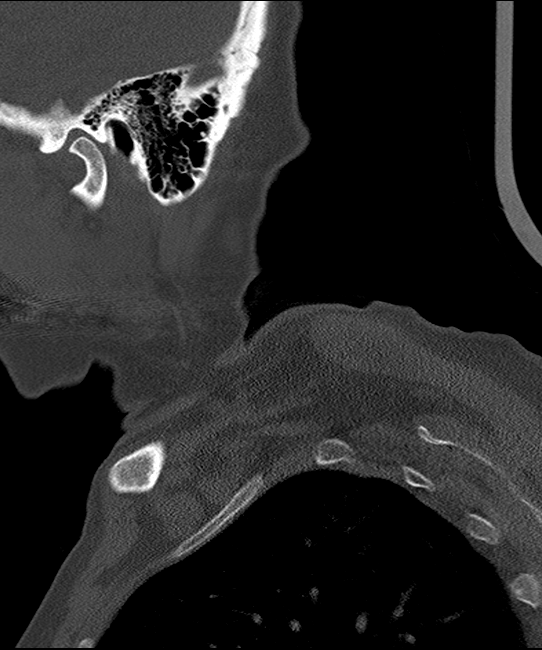
[im 46/91  bone]
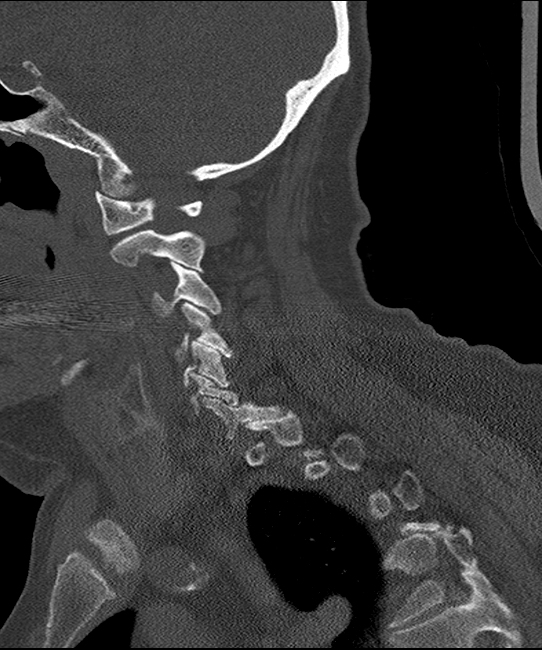
[im 68/91  bone]
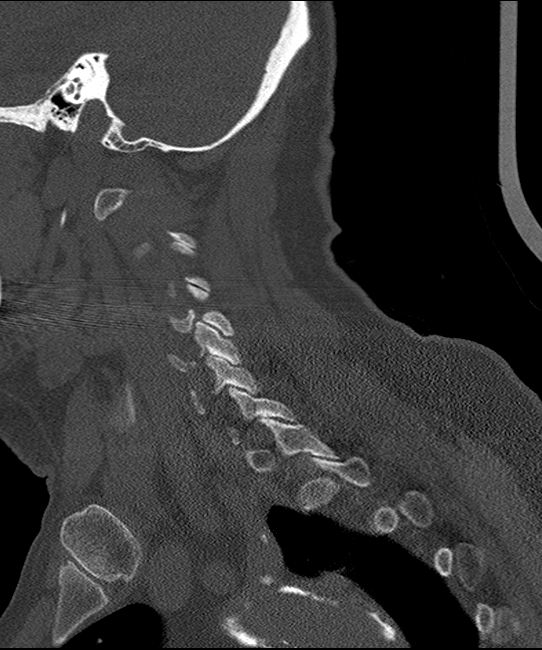

[Series 406: orthogonals, idose (2) · axial · 0.46mm/px · z∈[+796,+865]mm · 2 of 108 slices shown, 3 images]
[im 36/108  soft-tissue]
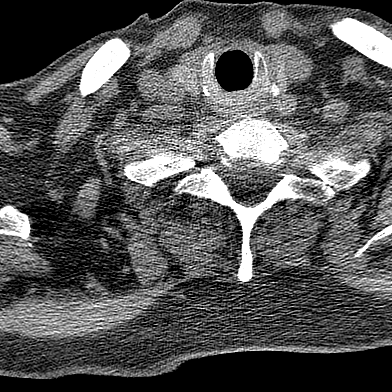
[im 36/108  bone]
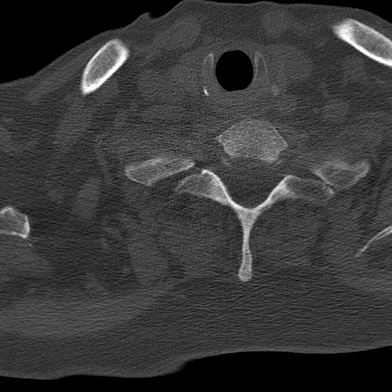
[im 72/108  bone]
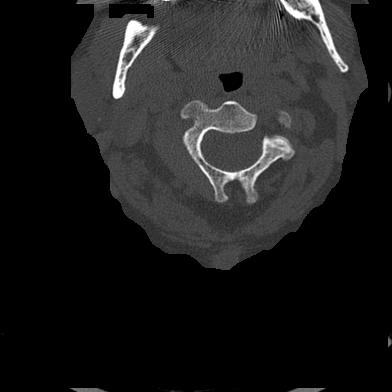

[8 of 33 positions shown; findings below may reference images not displayed]

FINDINGS: CT HEAD FINDINGS

Brain: There is extensive generalized atrophy. Ventricles in
proportion appear prominent with respect to sulci. There is a tiny
focus of increased attenuation just to the right of the anterior
falx in the right frontal lobe measuring 5 x 4 mm. This area may
represent a small hemorrhagic contusion. No other foci suggesting
acute hemorrhage evident. There is no intracranial mass, extra-axial
fluid collection, or midline shift. There is mild decreased
attenuation in the centra semiovale bilaterally. Suspect small
vessel disease, although there may be a degree of interstitial edema
given the degree of ventricular enlargement. Elsewhere gray-white
compartments appear normal. No acute infarct is evident.

Vascular: No hyperdense vessel. There are scattered foci of
calcification in the carotid siphon regions bilaterally.

Skull: The bony calvarium appears intact.

Other: Mastoid air cells are clear. There is a lipoma in the midline
overlying the frontal bones measuring 1.8 x 0.5 cm.

CT MAXILLOFACIAL FINDINGS

Osseous: There is a fracture along the distal aspect of the right
nasal bone with tiny from fragments noted in the soft tissues
adjacent to the distal right nasal bone. No other fractures are
evident. No dislocation. No blastic or lytic bone lesions. There is
moderate degenerative type change in each temporomandibular joint.

Orbits: Orbits appear symmetric bilaterally. There are no
intraorbital lesions.

Sinuses: There is mucosal thickening in multiple ethmoid air cells
bilaterally. A a 6 mm benign osteomas noted in a midportion right
ethmoid air cell. There is mild mucosal thickening in each inferior
maxillary antrum. Other paranasal sinuses are clear. No air-fluid
level. No bony destruction or expansion.

Soft tissues: Salivary glands appear normal. No adenopathy.
Visualize pharynx appears normal. Tongue and tongue base regions
appear normal.

CT CERVICAL SPINE FINDINGS

Alignment: There is cervical levoscoliosis. There is 2 mm of
retrolisthesis of C4 on C5. There is 3 mm of anterolisthesis of C6
on C7. There is 1 mm of anterolisthesis of C7 on T1.

Skull base and vertebrae: Skull base and craniocervical junction
regions appear normal. No other fracture. No blastic or lytic bone
lesions are apparent.

Soft tissues and spinal canal: Prevertebral soft tissues and
predental space regions are normal. There are no paraspinous
lesions. There is no cord or canal hematoma. There is a large
posterior osteophyte arising from the superior aspect of C4 on the
right which impresses on the thecal sac causing it moderate focal
spinal stenosis and impression on the cord at this level on the
right ventrally.

Disc levels: There is moderate disc space narrowing at C3-4 and C4-5
with slightly greater disc space narrowing at C5-6 and C6-7. There
is moderate disc space narrowing at C7-T1. There is multilevel facet
hypertrophy. No frank disc extrusion.

Upper chest: There is aortic atherosclerosis. Visualized lung apices
are clear.

Other: There are foci of carotid artery calcification bilaterally,
somewhat more severe on the right than on the left.
IMPRESSION: CT head: Diffuse atrophy with questionable superimposed normal
pressure hydrocephalus. Questions small vessel disease in the centra
semiovale bilaterally versus interstitial edema due to the degree of
ventricular enlargement. No acute infarct.

Suspect small focal hemorrhagic contusion just to the right of the
anterior falx in the medial right frontal lobe. No other hemorrhage
evident. No mass or midline shift. No extra-axial fluid. Midline
frontal scalp lipoma, benign in appearance.

CT maxillofacial: Fracture distal aspect of the nasal bone on the
right, with several tiny fracture fragments in this area. No other
fracture. No dislocation. Relatively mild paranasal sinus disease
involving primarily the ethmoid air cells. No air-fluid levels. No
intraorbital lesion. No dislocations. Degenerative type changes
noted in each temporomandibular joint.

CT cervical spine: Scoliosis. Areas of mild spondylolisthesis at
several levels are felt to be due to underlying spondylosis. No
fracture. Multilevel arthropathy. There is bilateral carotid artery
calcification as well as aortic atherosclerotic calcification.

Critical Value/emergent results were called by telephone at the time
of interpretation on 05/02/2016 at [DATE] to HAMDITA TILA, PA, who
verbally acknowledged these results.

## 2018-11-09 ENCOUNTER — Telehealth (HOSPITAL_COMMUNITY): Payer: Self-pay | Admitting: Neurology

## 2018-11-09 NOTE — Telephone Encounter (Signed)
I returned phone call from patient's daughter Dr. Ivory Broad  Last week while she was visiting here from [place] who expressed desire to reduce his medications as they felt that he was total care with severe advanced Alzheimer's and wondered if continuing Aricept and Namenda was necessary.  I agreed to taper the Aricept and discontinue it and after a few months may consider tapering and stopping the Namenda as well.  She voiced understanding

## 2018-12-02 ENCOUNTER — Other Ambulatory Visit: Payer: Self-pay | Admitting: Neurology

## 2019-03-08 NOTE — Progress Notes (Deleted)
Office Visit Note  Patient: David [name]             Date of Birth: 04/02/1926           MRN: [id number]             PCP: Wenda Low, MD Referring: Wenda Low, MD Visit Date: 03/11/2019 Occupation: @GUAROCC @  Subjective:  No chief complaint on file.   History of Present Illness: David [name] is a 86 y.o. male ***   Activities of Daily Living:  Patient reports morning stiffness for *** {minute/hour:19697}.   Patient {ACTIONS;DENIES/REPORTS:21021675::"Denies"} nocturnal pain.  Difficulty dressing/grooming: {ACTIONS;DENIES/REPORTS:21021675::"Denies"} Difficulty climbing stairs: {ACTIONS;DENIES/REPORTS:21021675::"Denies"} Difficulty getting out of chair: {ACTIONS;DENIES/REPORTS:21021675::"Denies"} Difficulty using hands for taps, buttons, cutlery, and/or writing: {ACTIONS;DENIES/REPORTS:21021675::"Denies"}  No Rheumatology ROS completed.   PMFS History:  Patient Active Problem List   Diagnosis Date Noted  . Primary osteoarthritis of both knees 10/15/2016  . Dyslipidemia 07/16/2016  . History of glaucoma 07/16/2016  . Bilateral lower extremity edema 06/06/2016  . Chronic pain of both knees 04/17/2016  . Chronic left shoulder pain 04/17/2016  . CRI (chronic renal insufficiency) 05/03/2015  . Alzheimer's disease ([place]) 08/31/2014  . Agitation 08/31/2014  . Dementia ([place]) 08/31/2014  . Onychomycosis 01/28/2014  . 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 Nonspecific abnormal results of cardiovascular function study 03/02/2013  . Shortness of breath 03/02/2013  . Essential hypertension, benign 01/08/2013    Past Medical History:  Diagnosis Date  . Abnormal stress test    a. 2014 - managed medically.  . Alzheimer disease ([place])   . Chronic diastolic CHF (congestive heart failure) ([place])   . CKD (chronic kidney disease), stage III ([place])   . Colon polyp 2007  . Elevated PSA    4-7 in 2009  . Glaucoma   . Hernia, inguinal   . Hypercholesteremia   .  Hypertension    grade 1 diastolic dysfunction, trace MR and TR, mild LVH Echo 3-/4  . Low back pain   . Macular degeneration   . Memory deficit   . Prostate disease   . RBBB   . Urinary incontinence     Family History  Problem Relation Age of Onset  . Dementia Mother   . Cancer Daughter   . Stroke Sister        X70  . Dementia Sister   . Heart attack Neg Hx   . Hypertension Neg Hx    Past Surgical History:  Procedure Laterality Date  . [place]   Social History   Social History Narrative   Patient is married with 4 children.   Patient is right handed.   Patient has college education.   Caffeine Use: 3 cups daily     There is no immunization history on file for this patient.   Objective: Vital Signs: There were no vitals taken for this visit.   Physical Exam   Musculoskeletal Exam: ***  CDAI Exam: CDAI Score: - Patient Global: -; Provider Global: - Swollen: -; Tender: - Joint Exam 03/11/2019   No joint exam has been documented for this visit   There is currently no information documented on the homunculus. Go to the Rheumatology activity and complete the homunculus joint exam.  Investigation: No additional findings.  Imaging: No results found.  Recent Labs: Lab Results  Component Value Date   WBC 11.3 (H) 05/02/2016   HGB 13.1 05/02/2016   PLT 213 05/02/2016   NA 140 05/02/2016   K  4.0 05/02/2016   CL 103 05/02/2016   CO2 29 05/02/2016   GLUCOSE 99 05/02/2016   BUN 25 (H) 05/02/2016   CREATININE 1.28 (H) 05/02/2016   BILITOT 0.5 04/10/2013   ALKPHOS 79 04/10/2013   AST 21 04/10/2013   ALT 14 04/10/2013   PROT 7.9 04/10/2013   ALBUMIN 3.9 04/10/2013   CALCIUM 8.6 (L) 05/02/2016   GFRAA 56 (L) 05/02/2016    Speciality Comments: No specialty comments available.  Procedures:  No procedures performed Allergies: Coreg [carvedilol] and Norvasc [amlodipine besylate]   Assessment / Plan:     Visit Diagnoses: No diagnosis  found.  Orders: No orders of the defined types were placed in this encounter.  No orders of the defined types were placed in this encounter.   Face-to-face time spent with patient was *** minutes. Greater than 50% of time was spent in counseling and coordination of care.  Follow-Up Instructions: No follow-ups on file.   Ellen Henri, CMA  Note - This record has been created using Animal nutritionist.  Chart creation errors have been sought, but may not always  have been located. Such creation errors do not reflect on  the standard of medical care.

## 2019-03-10 NOTE — Progress Notes (Signed)
Office Visit Note  Patient: David [name]             Date of Birth: April 30, 1926           MRN: [id number]             PCP: Wenda Low, MD Referring: Wenda Low, MD Visit Date: 03/17/2019 Occupation: @GUAROCC @  Subjective:  Pain in both knees   History of Present Illness: David [name] is a 86 y.o. male with history of osteoarthritis of both knee joints. He presents today with pain in both knee joints.  He is in a wheelchair primarily.  He has not had any left shoulder joint pain recently. He has not been able to express much due to Alzheimer's. His son stated that he has been holding onto his knees and discomfort. Every time he gets cortisone injection his mood gets better and he is able to mobilize better. The family requests that patient should receive repeat cortisone injections.  Activities of Daily Living:  Patient reports joint stiffness all day  Patient Denies nocturnal pain.  Difficulty dressing/grooming: Reports Difficulty climbing stairs: Reports Difficulty getting out of chair: Reports Difficulty using hands for taps, buttons, cutlery, and/or writing: Reports  Review of Systems  Constitutional: Negative for fatigue and night sweats.  HENT: Negative for mouth sores, mouth dryness and nose dryness.   Eyes: Negative for redness and dryness.  Respiratory: Negative for cough, hemoptysis, shortness of breath and difficulty breathing.   Cardiovascular: Positive for swelling in legs/feet. Negative for chest pain, palpitations, hypertension and irregular heartbeat.  Gastrointestinal: Negative for blood in stool, constipation and diarrhea.  Endocrine: Negative for increased urination.  Genitourinary: Negative for painful urination and hematuria.  Musculoskeletal: Positive for arthralgias, gait problem, joint pain and morning stiffness. Negative for joint swelling, myalgias, muscle weakness, muscle tenderness and myalgias.  Skin: Negative for color change, rash, hair  loss, nodules/bumps, skin tightness, ulcers and sensitivity to sunlight.  Allergic/Immunologic: Negative for susceptible to infections.  Neurological: Positive for weakness. Negative for dizziness, fainting, memory loss and night sweats.  Hematological: Negative for swollen glands.  Psychiatric/Behavioral: Negative for depressed mood and sleep disturbance. The patient is not nervous/anxious.     PMFS History:  Patient Active Problem List   Diagnosis Date Noted  . Primary osteoarthritis of both knees 10/15/2016  . Dyslipidemia 07/16/2016  . History of glaucoma 07/16/2016  . Bilateral lower extremity edema 06/06/2016  . Chronic pain of both knees 04/17/2016  . Chronic left shoulder pain 04/17/2016  . CRI (chronic renal insufficiency) 05/03/2015  . Alzheimer's disease ([place]) 08/31/2014  . Agitation 08/31/2014  . Dementia ([place]) 08/31/2014  . Onychomycosis 01/28/2014  . 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 Nonspecific abnormal results of cardiovascular function study 03/02/2013  . Shortness of breath 03/02/2013  . Essential hypertension, benign 01/08/2013    Past Medical History:  Diagnosis Date  . Abnormal stress test    a. 2014 - managed medically.  . Alzheimer disease ([place])   . Chronic diastolic CHF (congestive heart failure) ([place])   . CKD (chronic kidney disease), stage III   . Colon polyp 2007  . Elevated PSA    4-7 in 2009  . Glaucoma   . Hernia, inguinal   . Hypercholesteremia   . Hypertension    grade 1 diastolic dysfunction, trace MR and TR, mild LVH Echo 3-/4  . Low back pain   . Macular degeneration   . Memory deficit   . Prostate disease   .  RBBB   . Urinary incontinence     Family History  Problem Relation Age of Onset  . Dementia Mother   . Cancer Daughter   . Stroke Sister        [id number]  . Dementia Sister   . Heart attack Neg Hx   . Hypertension Neg Hx    Past Surgical History:  Procedure Laterality Date  . GALLBLADDER SURGERY  1996    Social History   Social History Narrative   Patient is married with 4 children.   Patient is right handed.   Patient has college education.   Caffeine Use: 3 cups daily     There is no immunization history on file for this patient.   Objective: Vital Signs: BP (!) 144/61 (BP Location: Left Arm, Patient Position: Sitting, Cuff Size: Normal)   Pulse 60   Resp 13   Ht 5\' 5"  (1.651 m) Comment: per caregiver, pt in wheelchair  Wt 164 lb (74.4 kg) Comment: per caregiver, pt in wheelchair  BMI 27.29 kg/m    Physical Exam Vitals and nursing note reviewed.  Constitutional:      Appearance: He is well-developed.  HENT:     Head: Normocephalic and atraumatic.  Eyes:     Conjunctiva/sclera: Conjunctivae normal.     Pupils: Pupils are equal, round, and reactive to light.  Cardiovascular:     Rate and Rhythm: Normal rate and regular rhythm.     Heart sounds: Normal heart sounds.  Pulmonary:     Effort: Pulmonary effort is normal.     Breath sounds: Normal breath sounds.  Abdominal:     General: Bowel sounds are normal.     Palpations: Abdomen is soft.  Musculoskeletal:     Cervical back: Normal range of motion and neck supple.  Skin:    General: Skin is warm and dry.     Capillary Refill: Capillary refill takes less than 2 seconds.  Neurological:     Mental Status: He is alert and oriented to person, place, and time.  Psychiatric:        Behavior: Behavior normal.      Musculoskeletal Exam: Wrist joints, MCPs and PIPs had no synovitis. Knee joints have limited extension with discomfort.  No warmth or effusion of knee joints noted.    CDAI Exam: CDAI Score: -- Patient Global: --; Provider Global: -- Swollen: --; Tender: -- Joint Exam 03/17/2019   No joint exam has been documented for this visit   There is currently no information documented on the homunculus. Go to the Rheumatology activity and complete the homunculus joint exam.  Investigation: No additional  findings.  Imaging: No results found.  Recent Labs: Lab Results  Component Value Date   WBC 11.3 (H) 05/02/2016   HGB 13.1 05/02/2016   PLT 213 05/02/2016   NA 140 05/02/2016   K 4.0 05/02/2016   CL 103 05/02/2016   CO2 29 05/02/2016   GLUCOSE 99 05/02/2016   BUN 25 (H) 05/02/2016   CREATININE 1.28 (H) 05/02/2016   BILITOT 0.5 04/10/2013   ALKPHOS 79 04/10/2013   AST 21 04/10/2013   ALT 14 04/10/2013   PROT 7.9 04/10/2013   ALBUMIN 3.9 04/10/2013   CALCIUM 8.6 (L) 05/02/2016   GFRAA 56 (L) 05/02/2016    Speciality Comments: No specialty comments available.  Procedures:  Large Joint Inj: bilateral knee on 03/17/2019 11:29 AM Indications: pain Details: 27 G 1.5 in needle, medial approach  Arthrogram: No  Medications (Right):  1.5 mL lidocaine 1 %; 40 mg triamcinolone acetonide 40 MG/ML Medications (Left): 1.5 mL lidocaine 1 %; 40 mg triamcinolone acetonide 40 MG/ML Outcome: tolerated well, no immediate complications Procedure, treatment alternatives, risks and benefits explained, specific risks discussed. Consent was given by the patient. Immediately prior to procedure a time out was called to verify the correct patient, procedure, equipment, support staff and site/side marked as required. Patient was prepped and draped in the usual sterile fashion.     Allergies: Coreg [carvedilol] and Norvasc [amlodipine besylate]    Assessment / Plan:     Visit Diagnoses: Primary osteoarthritis of both knees - He presents with pain in both knee joints.  He is primarily wheelchair bound but is able to walk with assistance when his knee joints are hurting him.  He had cortisone injections in bilateral knee injections on 07/30/2018.  He presented today for bilateral knee joint cortisone injections.  Procedure note completed above.  He will follow up as needed.   Adhesive capsulitis of left shoulder: He has no discomfort at this time.   Other medical conditions are listed as follows:    History of glaucoma  Dyslipidemia  Dementia in Alzheimer's disease (HCC)  Chronic renal impairment, stage 3 (moderate), unspecified whether stage 3a or 3b CKD  Orders: Orders Placed This Encounter  Procedures  . Large Joint Inj: bilateral knee   No orders of the defined types were placed in this encounter.    Follow-Up Instructions: Return if symptoms worsen or fail to improve, for Osteoarthritis.   Pollyann Savoy, MD   Scribed by-  Sherron Ales, PA-C  Note - This record has been created using Dragon software.  Chart creation errors have been sought, but may not always  have been located. Such creation errors do not reflect on  the standard of medical care.

## 2019-03-11 ENCOUNTER — Ambulatory Visit: Payer: Medicare Other | Admitting: Rheumatology

## 2019-03-17 ENCOUNTER — Encounter: Payer: Self-pay | Admitting: Rheumatology

## 2019-03-17 ENCOUNTER — Ambulatory Visit (INDEPENDENT_AMBULATORY_CARE_PROVIDER_SITE_OTHER): Payer: Medicare Other | Admitting: Rheumatology

## 2019-03-17 ENCOUNTER — Other Ambulatory Visit: Payer: Self-pay

## 2019-03-17 VITALS — BP 144/61 | HR 60 | Resp 13 | Ht 65.0 in | Wt 164.0 lb

## 2019-03-17 DIAGNOSIS — E785 Hyperlipidemia, unspecified: Secondary | ICD-10-CM | POA: Diagnosis not present

## 2019-03-17 DIAGNOSIS — Z8669 Personal history of other diseases of the nervous system and sense organs: Secondary | ICD-10-CM

## 2019-03-17 DIAGNOSIS — M170 Bilateral primary osteoarthritis of knee: Secondary | ICD-10-CM

## 2019-03-17 DIAGNOSIS — M7502 Adhesive capsulitis of left shoulder: Secondary | ICD-10-CM

## 2019-03-17 DIAGNOSIS — G309 Alzheimer's disease, unspecified: Secondary | ICD-10-CM

## 2019-03-17 DIAGNOSIS — F0280 Dementia in other diseases classified elsewhere without behavioral disturbance: Secondary | ICD-10-CM

## 2019-03-17 DIAGNOSIS — N183 Chronic kidney disease, stage 3 unspecified: Secondary | ICD-10-CM

## 2019-03-17 MED ORDER — LIDOCAINE HCL 1 % IJ SOLN
1.5000 mL | INTRAMUSCULAR | Status: AC | PRN
  Administered 2019-03-17: 1.5 mL

## 2019-03-17 MED ORDER — TRIAMCINOLONE ACETONIDE 40 MG/ML IJ SUSP
40.0000 mg | INTRAMUSCULAR | Status: AC | PRN
  Administered 2019-03-17: 40 mg via INTRA_ARTICULAR

## 2019-03-18 ENCOUNTER — Ambulatory Visit: Payer: Medicare Other

## 2019-03-26 ENCOUNTER — Ambulatory Visit: Payer: Medicare Other

## 2019-03-29 ENCOUNTER — Ambulatory Visit: Payer: Medicare Other

## 2019-04-26 IMAGING — US US EXTREM LOW VENOUS*R*
1 series · 13 of 24 positions shown · non-contrast
Comparison: None.

CLINICAL DATA: 89-year-old male with left lower extremity pain and
swelling for 3 days.



[Series 1: us extrem low venous*right* · 13 of 32 slices shown]
[im 1/32]
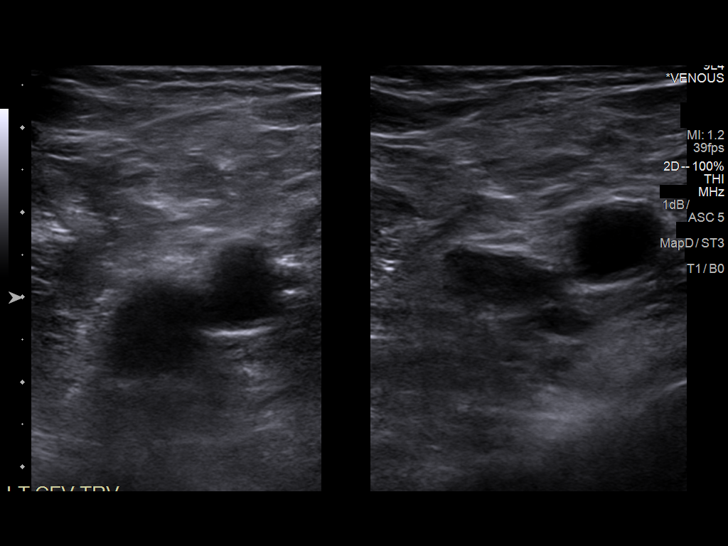
[im 3/32]
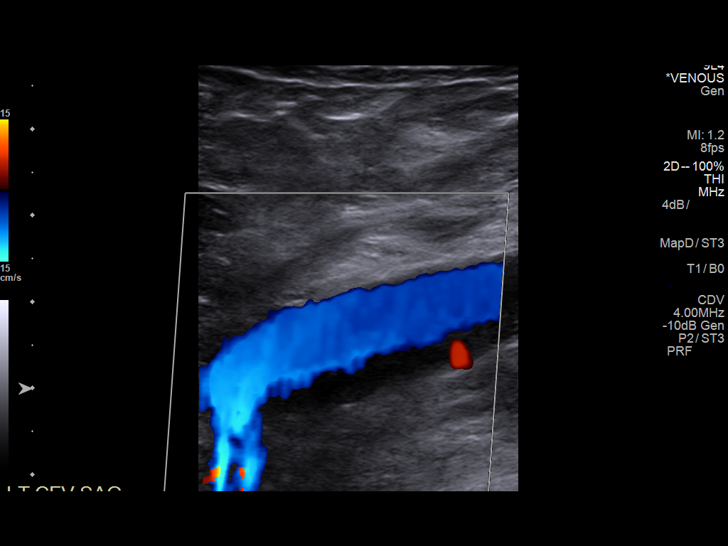
[im 6/32]
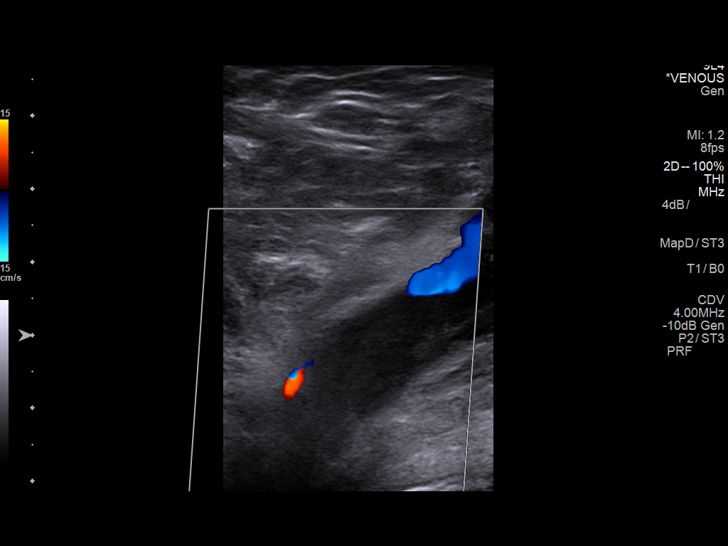
[im 9/32]
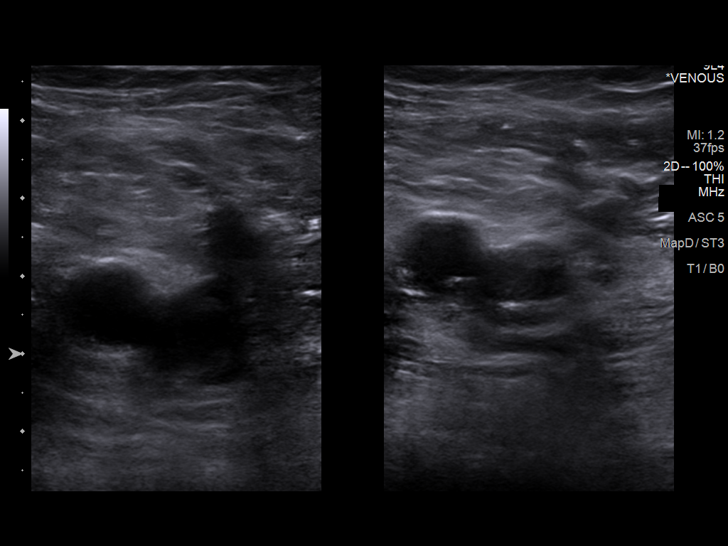
[im 11/32]
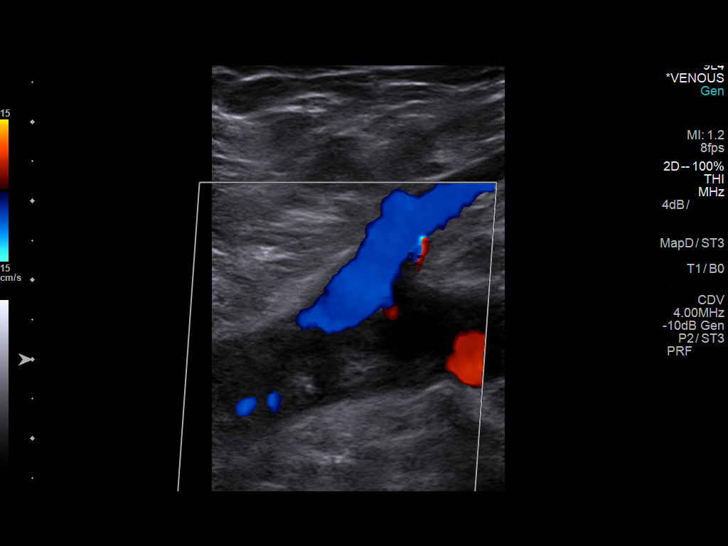
[im 14/32]
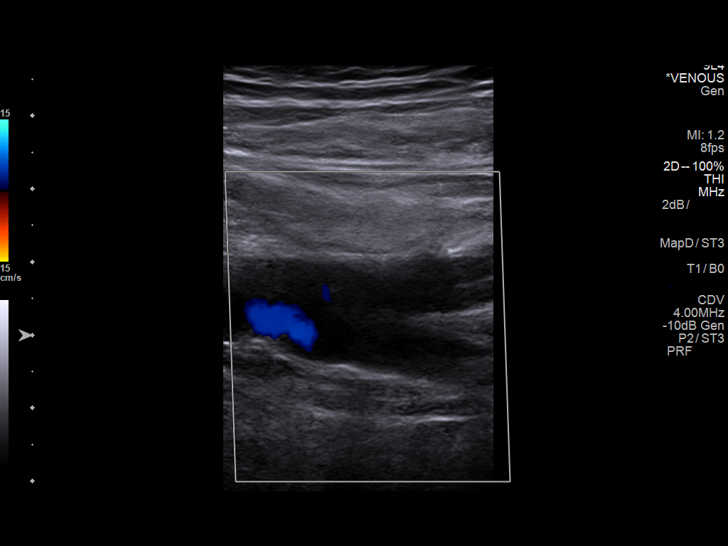
[im 17/32]
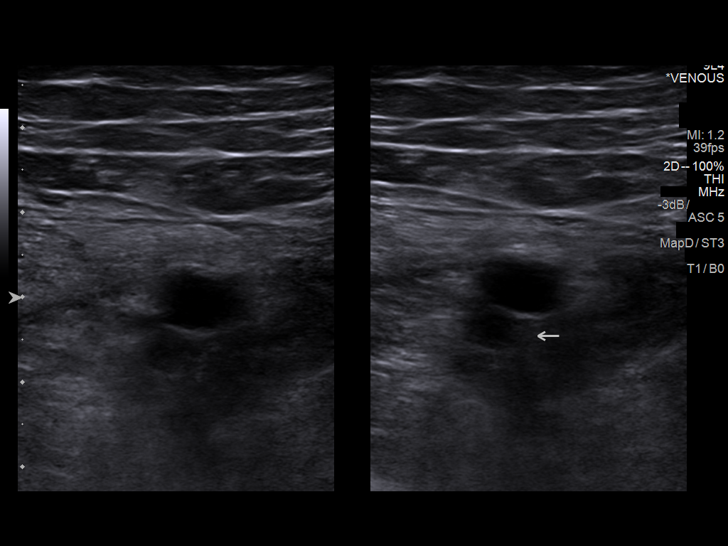
[im 18/32]
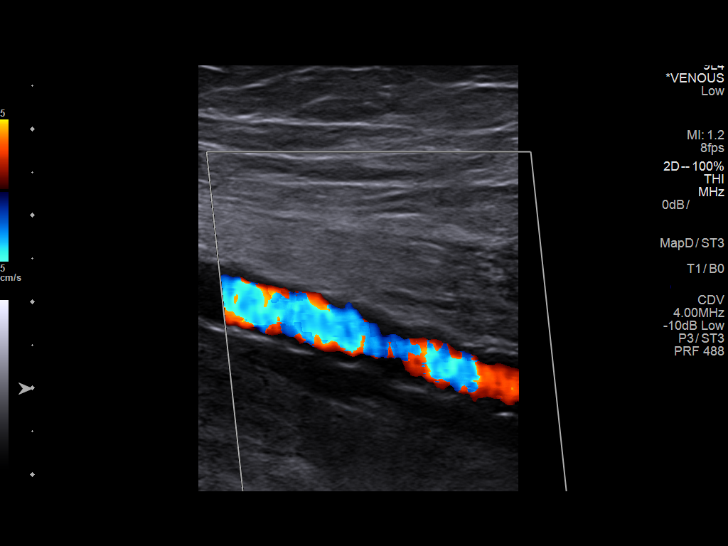
[im 21/32]
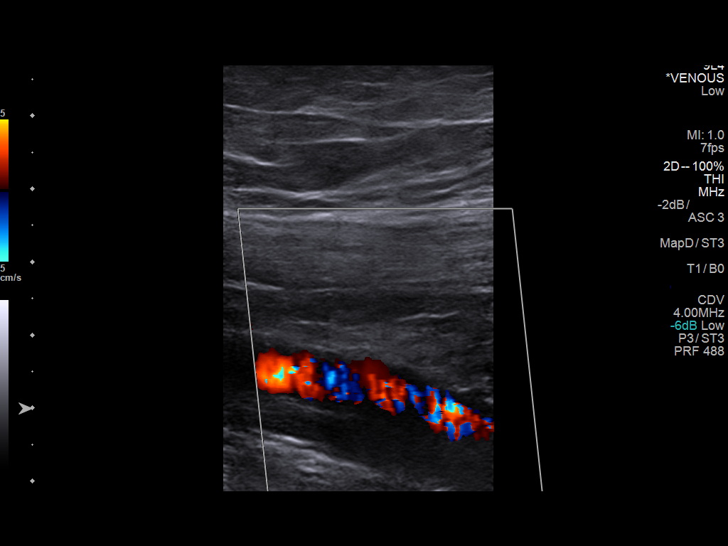
[im 23/32]
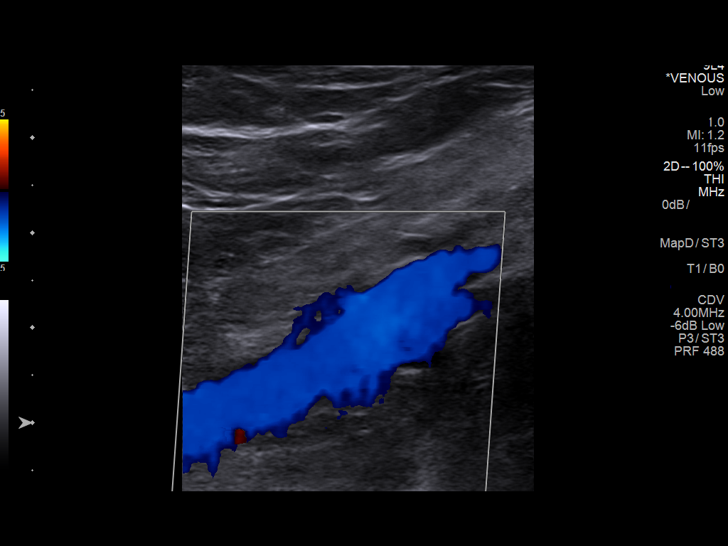
[im 26/32]
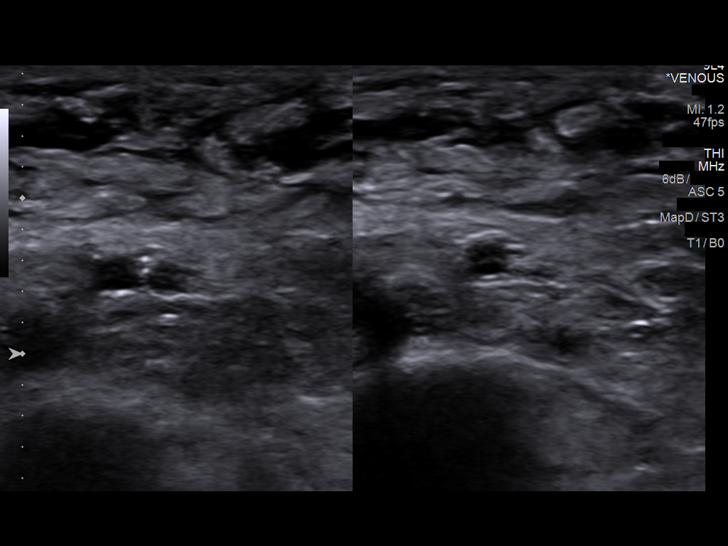
[im 29/32]
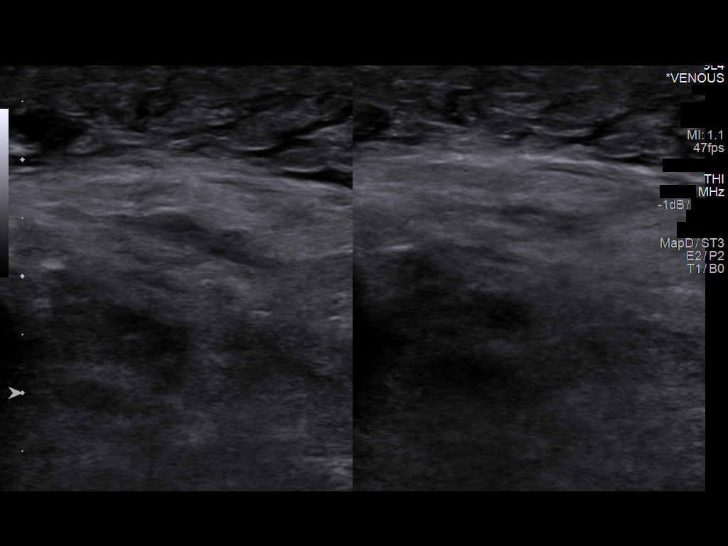
[im 32/32]
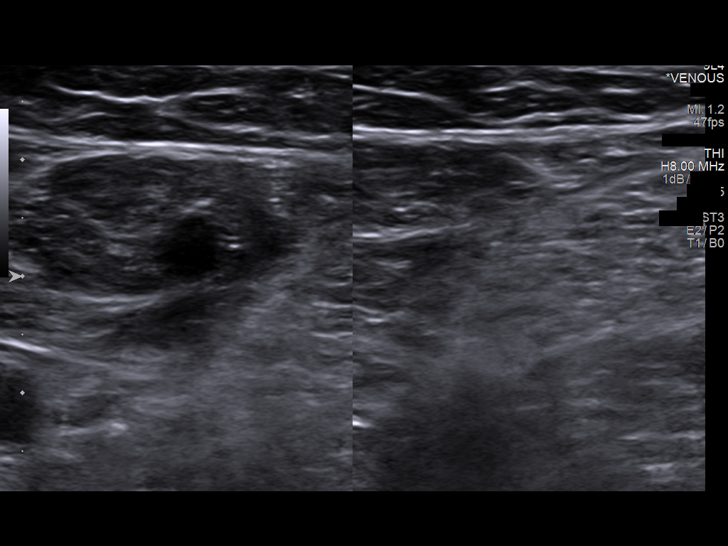

[13 of 24 positions shown; findings below may reference images not displayed]

FINDINGS: DVT is identified within the right common femoral, profunda femoral
and femoral veins, occlusive in the femoral vein.

The right popliteal vein is patent without DVT.

The right calf veins are not well visualized.

Partially occlusive DVT is present within the left common femoral
vein.
IMPRESSION: DVT within bilateral common femoral veins and right profunda femoral
and femoral veins.

Critical Value/emergent results were called by telephone at the time
of interpretation on 11/16/2016 at [DATE] to Dr. SAFIA TRAIL , who
verbally acknowledged these results.

## 2019-06-29 ENCOUNTER — Telehealth: Payer: Self-pay | Admitting: Physician Assistant

## 2019-06-29 NOTE — Telephone Encounter (Signed)
Attempted to call patient about homebound COVID19 vaccination program.  No answer.  left voice mail to call back.   [place] Passey, PA - C

## 2019-06-30 ENCOUNTER — Telehealth: Payer: Self-pay | Admitting: Interventional Cardiology

## 2019-06-30 NOTE — Telephone Encounter (Signed)
Returned call to pt.  They have been made aware that they will need to call the infusion / vaccine clinic for this, that Vin left a message, but wasn't pertaining to Cardiology.

## 2019-06-30 NOTE — Telephone Encounter (Signed)
Patient called back about an Infusion at home clinic.

## 2019-07-01 ENCOUNTER — Telehealth: Payer: Self-pay | Admitting: Physician Assistant

## 2019-07-01 NOTE — Telephone Encounter (Signed)
I connected by phone with David [name] and/or patient's caregiver on 07/01/2019 at 11:29 AM to discuss the potential vaccination through our Homebound vaccination initiative.   Hx obtained from [facility].   Prevaccination Checklist for COVID-19 Vaccines  1.  Are you feeling sick today? no  2.  Have you ever received a dose of a COVID-19 vaccine?  yes      If yes, which one? Pfizer   3.  Have you ever had an allergic reaction: (This would include a severe reaction [ e.g., anaphylaxis] that required treatment with epinephrine or EpiPen or that caused you to go to the hospital.  It would also include an allergic reaction that occurred within 4 hours that caused hives, swelling, or respiratory distress, including wheezing.) A.  A previous dose of COVID-19 vaccine. no  B.  A vaccine or injectable therapy that contains multiple components, one of which is a COVID-19 vaccine component, but it is not known which component elicited the immediate reaction. no  C.  Are you allergic to polyethylene glycol? no   4.  Have you ever had an allergic reaction to another vaccine (other than COVID-19 vaccine) or an injectable medication? (This would include a severe reaction [ e.g., anaphylaxis] that required treatment with epinephrine or EpiPen or that caused you to go to the hospital.  It would also include an allergic reaction that occurred within 4 hours that caused hives, swelling, or respiratory distress, including wheezing.)  no   5.  Have you ever had a severe allergic reaction (e.g., anaphylaxis) to something other than a component of the COVID-19 vaccine, or any vaccine or injectable medication?  This would include food, pet, venom, environmental, or oral medication allergies.  no   6.  Have you received any vaccine in the last 14 days? no   7.  Have you ever had a positive test for COVID-19 or has a doctor ever told you that you had COVID-19?  no   8.  Have you received passive antibody  therapy (monoclonal antibodies or convalescent serum) as a treatment for COVID-19? no   9.  Do you have a weakened immune system caused by something such as HIV infection or cancer or do you take immunosuppressive drugs or therapies?  no   10.  Do you have a bleeding disorder or are you taking a blood thinner? yes on Eliquis    11.  Are you pregnant or breast-feeding? no   12.  Do you have dermal fillers? no   __________________   This patient is a 86 y.o. male that meets the FDA criteria to receive homebound vaccination. Patient or parent/caregiver understands they have the option to accept or refuse homebound vaccination.  Patient passed the pre-screening checklist and would like to proceed with homebound vaccination.  Based on questionnaire above, I recommend the patient be observed for 30 minutes.  There are an estimated #0 of other household members/caregivers who are also interested in receiving the vaccine.   I will send the patient's information to our scheduling team who will reach out to schedule the patient and potential caregiver/family members for homebound vaccination.    [name] 07/01/2019 11:29 AM

## 2019-07-29 ENCOUNTER — Other Ambulatory Visit: Payer: Self-pay | Admitting: Interventional Cardiology

## 2019-08-17 ENCOUNTER — Telehealth: Payer: Self-pay | Admitting: *Deleted

## 2019-08-17 ENCOUNTER — Ambulatory Visit: Payer: Medicare Other | Attending: Critical Care Medicine

## 2019-08-17 DIAGNOSIS — Z23 Encounter for immunization: Secondary | ICD-10-CM

## 2019-08-17 NOTE — Telephone Encounter (Signed)
Copied from CRM [id number]. Topic: General - Other >> Aug 17, 2019  1:54 PM Jaquita Rector A wrote: Reason for CRM: Patient son Viviano Simas called to say that his father was to have his second covid vaccine but no one have contacted him to say that they are running behind. Ajay can be reached at Ph# [phone]  This RN emailed  [email or web address] to inform them of the above concerns and ask the Home Bound Vaccine team to call the patient's son to reschedule a time for the patient's vaccine.

## 2019-08-17 NOTE — Progress Notes (Signed)
   Covid-19 Vaccination Clinic  Name:  David [name]    MRN: [id number] DOB: 10/06/1926  08/17/2019  [name] was observed post Covid-19 immunization for 15 minutes without incident. He was provided with Vaccine Information Sheet and instruction to access the V-Safe system.   [name] was instructed to call 911 with any severe reactions post vaccine: Marland Kitchen Difficulty breathing  . Swelling of face and throat  . A fast heartbeat  . A bad rash all over body  . Dizziness and weakness   Immunizations Administered    Name Date Dose VIS Date Route   Pfizer COVID-19 Vaccine 08/17/2019  1:55 PM 0.3 mL 04/14/2018 Intramuscular   Manufacturer: [employer], [employer]   Lot: [id number]   NDC: [address]-2

## 2019-08-26 ENCOUNTER — Other Ambulatory Visit: Payer: Self-pay | Admitting: Interventional Cardiology

## 2019-09-21 ENCOUNTER — Other Ambulatory Visit: Payer: Self-pay | Admitting: Interventional Cardiology

## 2019-12-31 ENCOUNTER — Other Ambulatory Visit: Payer: Self-pay | Admitting: Neurology

## 2020-02-15 ENCOUNTER — Ambulatory Visit: Payer: Medicare Other | Attending: Critical Care Medicine

## 2020-02-15 DIAGNOSIS — Z23 Encounter for immunization: Secondary | ICD-10-CM

## 2020-02-15 NOTE — Progress Notes (Signed)
   Covid-19 Vaccination Clinic  Name:  David [name]    MRN: [id number] DOB: Nov 13, 1926  02/15/2020  [name] was observed post Covid-19 immunization for 15 minutes without incident. He was provided with Vaccine Information Sheet and instruction to access the V-Safe system.   [name] was instructed to call 911 with any severe reactions post vaccine: Marland Kitchen Difficulty breathing  . Swelling of face and throat  . A fast heartbeat  . A bad rash all over body  . Dizziness and weakness   Immunizations Administered    Name Date Dose VIS Date Route   Pfizer COVID-19 Vaccine 02/15/2020 10:12 AM 0.3 mL 12/08/2019 Intramuscular   Manufacturer: [employer], [employer]   Lot: [id number]   NDC: [address]-4

## 2020-07-04 ENCOUNTER — Telehealth: Payer: Self-pay

## 2020-07-04 NOTE — Telephone Encounter (Signed)
I connected by phone with [name] and/or patient's caregiver on 07/04/2020 at 1:50 PM to discuss the potential vaccination through our Homebound vaccination initiative.   Prevaccination Checklist for COVID-19 Vaccines  1.  Are you feeling sick today? no  2.  Have you ever received a dose of a COVID-19 vaccine?  yes      If yes, which one? Pfizer   How many dose of Covid-19 vaccine have your received and dates ? 3, 03/10/2019, 08/17/2019, 02/15/2020   Check all that apply: I live in a long-term care setting. no  I have been diagnosed with a medical condition(s). Please list: Patient is bed bound (pertinent to homebound status)  I am a first responder. no  I work in a long-term care facility, correctional facility, hospital, restaurant, retail setting, school, or other setting with high exposure to the public. no  4. Do you have a health condition or are you undergoing treatment that makes you moderately or severely immunocompromised? (This would include treatment for cancer or HIV, receipt of organ transplant, immunosuppressive therapy or high-dose corticosteroids, CAR-T-cell therapy, hematopoietic cell transplant [HCT], DiGeorge syndrome or Wiskott-Aldrich syndrome)  no  5. Have you received hematopoietic cell transplant (HCT) or CAR-T-cell therapies since receiving COVID-19 vaccine? no  6.  Have you ever had an allergic reaction: (This would include a severe reaction [ e.g., anaphylaxis] that required treatment with epinephrine or EpiPen or that caused you to go to the hospital.  It would also include an allergic reaction that occurred within 4 hours that caused hives, swelling, or respiratory distress, including wheezing.) A.  A previous dose of COVID-19 vaccine. no  B.  A vaccine or injectable therapy that contains multiple components, one of which is a COVID-19 vaccine component, but it is not known which component elicited the immediate reaction. no  C.  Are you allergic to  polyethylene glycol? no  D. Are you allergic to Polysorbate, which is found in some vaccines, film coated tablets and intravenous steroids?  no   7.  Have you ever had an allergic reaction to another vaccine (other than COVID-19 vaccine) or an injectable medication? (This would include a severe reaction [ e.g., anaphylaxis] that required treatment with epinephrine or EpiPen or that caused you to go to the hospital.  It would also include an allergic reaction that occurred within 4 hours that caused hives, swelling, or respiratory distress, including wheezing.)  no   8.  Have you ever had a severe allergic reaction (e.g., anaphylaxis) to something other than a component of the COVID-19 vaccine, or any vaccine or injectable medication?  This would include food, pet, venom, environmental, or oral medication allergies.  no   Check all that apply to you:  Am a male between ages 86 and 33 years old  no  Women 43 through 85 years of age can receive any FDA-authorized or -approved COVID-19 vaccine. However, they should be informed of the rare but increased risk of thrombosis with thrombocytopenia syndrome (TTS) after receipt of the [employer] Vaccine and the availability of other FDA-authorized and -approved COVID-19 vaccines. People who had TTS after a first dose of Janssen vaccine should not receive a subsequent dose of Janssen product    Am a male between ages 37 and 15 years old  no Males 5 through 85 years of age may receive the correct formulation of Pfizer-BioNTech COVID-19 vaccine. Males 18 and older can receive any FDA-authorized or -approved vaccine. However, people receiving an mRNA COVID-19  vaccine, especially males 12 through 85 years of age and their parents/legal representative (when relevant), should be informed of the risk of developing myocarditis (an inflammation of the heart muscle) or pericarditis (inflammation of the lining around the heart) after receipt of an mRNA vaccine. The  risk of developing either myocarditis or pericarditis after vaccination is low, and lower than the risk of myocarditis associated with SARS-CoV-2 infection in adolescents and adults. Vaccine recipients should be counseled about the need to seek care if symptoms of myocarditis or pericarditis develop after vaccination     Have a history of myocarditis or pericarditis  no Myocarditis or pericarditis after receipt of the first dose of an mRNA COVID-19 vaccine series but before administration of the second dose  Experts advise that people who develop myocarditis or pericarditis after a dose of an mRNA COVID-19 vaccine not receive a subsequent dose of any COVID-19 vaccine, until additional safety data are available.  Administration of a subsequent dose of COVID-19 vaccine before safety data are available can be considered in certain circumstances after the episode of myocarditis or pericarditis has completely resolved. Until additional data are available, some experts recommend a [employer] COVID-19 vaccine be considered instead of an mRNA COVID-19 vaccine. Decisions about proceeding with a subsequent dose should include a conversation between the patient, their parent/legal representative (when relevant), and their clinical team, which may include a cardiologist.    Have been treated with monoclonal antibodies or convalescent serum to prevent or treat COVID-19  no Vaccination should be offered to people regardless of history of prior symptomatic or asymptomatic SARS-CoV-2 infection. There is no recommended minimal interval between infection and vaccination.  However, vaccination should be deferred if a patient received monoclonal antibodies or convalescent serum as treatment for COVID-19 or for post-exposure prophylaxis. This is a precautionary measure until additional information becomes available, to avoid interference of the antibody treatment with vaccine-induced immune responses.  Defer COVID-19 vaccination  for 30 days when a passive antibody product was used for post-exposure prophylaxis.  Defer COVID-19 vaccination for 90 days when a passive antibody product was used to treat COVID-19.     Diagnosed with Multisystem Inflammatory Syndrome (MIS-C or MIS-A) after a COVID-19 infection  no It is unknown if people with a history of MIS-C or MIS-A are at risk for a dysregulated immune response to COVID-19 vaccination.  People with a history of MIS-C or MIS-A may choose to be vaccinated. Considerations for vaccination may include:   Clinical recovery from MIS-C or MIS-A, including return to normal cardiac function   Personal risk of severe acute COVID-19 (e.g., age, underlying conditions)   High or substantial community transmission of SARS-CoV-2 and personal increased risk of reinfection.   Timing of any immunomodulatory therapies (general best practice guidelines for immunization can be consulted for more information [employer])   It has been 90 days or more since their diagnosis of MIS-C   Onset of MIS-C occurred before any COVID-19 vaccination   A conversation between the patient, their guardian(s), and their clinical team or a specialist may assist with COVID-19 vaccination decisions. Healthcare providers and health departments may also request a consultation from the Clinical Immunization Safety Assessment Project at [employer] [email or web address]/[email or web address]/index.[email or web address].     Have a bleeding disorder  no Take a blood thinner  no As with all vaccines, any COVID-19 vaccine product may be given to these patients, if a physician familiar with the patient's bleeding risk determines that the vaccine can be administered intramuscularly with reasonable safety.  ACIP recommends the following technique for intramuscular vaccination in patients with bleeding disorders or taking blood thinners: a fine-gauge needle (23-gauge or smaller  caliber) should be used for the vaccination, followed by firm pressure on the site, without rubbing, for at least 2 minutes.  People who regularly take aspirin or anticoagulants as part of their routine medications do not need to stop these medications prior to receipt of any COVID-19 vaccine.    Have a history of heparin-induced thrombocytopenia (HIT)  no Although the etiology of TTS associated with the [employer] COVID-19 vaccine is unclear, it appears to be similar to another rare immune-mediated syndrome, heparin-induced thrombocytopenia (HIT). People with a history of an episode of an immune-mediated syndrome characterized by thrombosis and thrombocytopenia, such as HIT, should be offered a currently FDA-approved or FDA-authorized mRNA COVID-19 vaccine if it has been ?90 days since their TTS resolved. After 90 days, patients may be vaccinated with any currently FDA-approved or FDA-authorized COVID-19 vaccine, including Janssen COVID-19 Vaccine. However, people who developed TTS after their initial Lin[employer] Dib[employer] vaccine should not receive a Janssen booster dose.  Experts believe the following factors do not make people more susceptible to TTS after receipt of the [employer]. People with these conditions can be vaccinated with any FDA-authorized or - approved COVID-19 vaccine, including the [employer] COVID-19 Vaccine:   A prior history of venous thromboembolism   Risk factors for venous thromboembolism (e.g., inherited or acquired thrombophilia including Factor V Leiden; prothrombin gene 20210A mutation; antiphospholipid syndrome; protein C, protein S or antithrombin deficiency   A prior history of other types of thromboses not associated with thrombocytopenia   Pregnancy, post-partum status, or receipt of hormonal contraceptives (e.g., combined oral contraceptives, patch, ring)   Additional recipient education materials can be found at [employer]  vaccines/safety/JJUpdate.html.    Am currently pregnant or breastfeeding  no Vaccination is recommended for all people aged 70 years and older, including people that are:   Pregnant   Breastfeeding   Trying to get pregnant now or who might become pregnant in the future   Pregnant, breastfeeding, and post-partum people 75 through 85 years of age should be aware of the rare risk of TTS after receipt of the [employer] COVID-19 Vaccine and the availability of other FDA-authorized or -approved COVID-19 vaccines (i.e., mRNA vaccines).    Have received dermal fillers  no FDA-authorized or -approved COVID-19 vaccines can be administered to people who have received injectable dermal fillers who have no contraindications for vaccination.  Infrequently, these people might experience temporary swelling at or near the site of filler injection (usually the face or lips) following administration of a dose of an mRNA COVID-19 vaccine. These people should be advised to contact their healthcare provider if swelling develops at or near the site of dermal filler following vaccination.     Have a history of Guillain-Barr Syndrome (GBS)  no People with a history of GBS can receive any FDA-authorized or -approved COVID-19 vaccine. However, given the possible association between the [employer] and an increased risk of GBS, a patient with a history of GBS and their clinical team should discuss the availability of mRNA vaccines to offer protection against COVID-19. The highest risk has been observed in men aged 50-64 years with symptoms of GBS beginning within 42 days after [employer] COVID-19 vaccination.  People who had GBS after receiving Janssen vaccine should be made aware of the option to receive an mRNA COVID-19 vaccine booster at least 2 months (8 weeks) after  the Janssen dose. However, [employer] vaccine may be used as a booster, particularly if GBS occurred more than 42 days after vaccination or was related  to a non-vaccine factor. Prior to booster vaccination, a conversation between the patient and their clinical team may assist with decisions about use of a COVID-19 booster dose, including the timing of administration     Postvaccination Observation Times for People without Contraindications to Covid 19 Vaccination.  30 minutes:  People with a history of: A contraindication to another type of COVID-19 vaccine product (i.e., mRNA or viral vector COVID-19 vaccines)   Immediate (within 4 hours of exposure) non-severe allergic reaction to a COVID-19 vaccine or injectable therapies   Anaphylaxis due to any cause   Immediate allergic reaction of any severity to a non-COVID-19 vaccine   15 minutes: All other people  This patient is a 87 y.o. male that meets the FDA criteria to receive homebound vaccination. Patient or parent/caregiver understands they have the option to accept or refuse homebound vaccination.  Patient passed the pre-screening checklist and would like to proceed with homebound vaccination.  Based on questionnaire above, I recommend the patient be observed for 15 minutes.  There are an estimated #0 other household members/caregivers who are also interested in receiving the vaccine.    The patient has been confirmed homebound and eligible for homebound vaccination with the considerations outlined above. I will send the patient's information to our scheduling team who will reach out to schedule the patient and potential caregiver/family members for homebound vaccination.    Skip Mayer 07/04/2020 1:50 PM

## 2021-03-21 DEATH — deceased
